# Patient Record
Sex: Male | Born: 1975 | Race: White | Hispanic: No | Marital: Single | Smoking: Current every day smoker
Health system: Southern US, Community
[De-identification: ages and names within clinical notes are randomized; demographics above are authoritative.]

## PROBLEM LIST (undated history)

## (undated) DIAGNOSIS — J45909 Unspecified asthma, uncomplicated: Secondary | ICD-10-CM

---

## 2008-02-16 ENCOUNTER — Emergency Department: Payer: Self-pay | Admitting: Emergency Medicine

## 2008-05-02 ENCOUNTER — Emergency Department: Payer: Self-pay | Admitting: Emergency Medicine

## 2011-10-19 ENCOUNTER — Emergency Department: Payer: Self-pay | Admitting: Emergency Medicine

## 2011-10-19 LAB — COMPREHENSIVE METABOLIC PANEL
Albumin: 4.4 g/dL (ref 3.4–5.0)
Alkaline Phosphatase: 61 U/L (ref 50–136)
Anion Gap: 5 — ABNORMAL LOW (ref 7–16)
BUN: 10 mg/dL (ref 7–18)
Calcium, Total: 8.9 mg/dL (ref 8.5–10.1)
Chloride: 107 mmol/L (ref 98–107)
Co2: 28 mmol/L (ref 21–32)
Creatinine: 1.01 mg/dL (ref 0.60–1.30)
EGFR (African American): >60
EGFR (Non-African Amer.): >60
Osmolality: 278 (ref 275–301)
Potassium: 3.6 mmol/L (ref 3.5–5.1)
SGOT(AST): 23 U/L (ref 15–37)
SGPT (ALT): 26 U/L
Total Protein: 7.8 g/dL (ref 6.4–8.2)

## 2011-10-19 LAB — CBC
HCT: 44.7 % (ref 40.0–52.0)
HGB: 15.1 g/dL (ref 13.0–18.0)
MCH: 29.6 pg (ref 26.0–34.0)
MCV: 88 fL (ref 80–100)
Platelet: 180 10*3/uL (ref 150–440)
RBC: 5.1 10*6/uL (ref 4.40–5.90)

## 2011-10-19 LAB — TROPONIN I: Troponin-I: <0.02 ng/mL

## 2011-10-20 ENCOUNTER — Emergency Department: Payer: Self-pay | Admitting: Emergency Medicine

## 2015-04-15 ENCOUNTER — Emergency Department
Admission: EM | Admit: 2015-04-15 | Discharge: 2015-04-15 | Disposition: A | Discharge status: Home / Self Care | Payer: Self-pay | Attending: Emergency Medicine | Admitting: Emergency Medicine

## 2015-04-15 ENCOUNTER — Encounter: Payer: Self-pay | Admitting: Emergency Medicine

## 2015-04-15 DIAGNOSIS — T675XXA Heat exhaustion, unspecified, initial encounter: Secondary | ICD-10-CM | POA: Insufficient documentation

## 2015-04-15 DIAGNOSIS — Y9289 Other specified places as the place of occurrence of the external cause: Secondary | ICD-10-CM | POA: Insufficient documentation

## 2015-04-15 DIAGNOSIS — Y93H2 Activity, gardening and landscaping: Secondary | ICD-10-CM | POA: Insufficient documentation

## 2015-04-15 DIAGNOSIS — X32XXXA Exposure to sunlight, initial encounter: Secondary | ICD-10-CM | POA: Insufficient documentation

## 2015-04-15 DIAGNOSIS — Y998 Other external cause status: Secondary | ICD-10-CM | POA: Insufficient documentation

## 2015-04-15 DIAGNOSIS — E860 Dehydration: Secondary | ICD-10-CM | POA: Insufficient documentation

## 2015-04-15 DIAGNOSIS — R51 Headache: Secondary | ICD-10-CM | POA: Insufficient documentation

## 2015-04-15 LAB — BASIC METABOLIC PANEL
ANION GAP: 9 (ref 5–15)
BUN: 16 mg/dL (ref 6–20)
CALCIUM: 9.1 mg/dL (ref 8.9–10.3)
CHLORIDE: 102 mmol/L (ref 101–111)
CO2: 24 mmol/L (ref 22–32)
CREATININE: 1.03 mg/dL (ref 0.61–1.24)
GFR calc non Af Amer: >60 mL/min (ref >60)
Glucose, Bld: 98 mg/dL (ref 65–99)
POTASSIUM: 3.1 mmol/L — AB (ref 3.5–5.1)
Sodium: 135 mmol/L (ref 135–145)

## 2015-04-15 LAB — CBC WITH DIFFERENTIAL/PLATELET
Basophils Absolute: 0 10*3/uL (ref 0–0.1)
Basophils Relative: 0 %
EOS PCT: 2 %
Eosinophils Absolute: 0.1 10*3/uL (ref 0–0.7)
HEMATOCRIT: 43.3 % (ref 40.0–52.0)
HEMOGLOBIN: 14.7 g/dL (ref 13.0–18.0)
LYMPHS ABS: 1.2 10*3/uL (ref 1.0–3.6)
LYMPHS PCT: 20 %
MCH: 28.9 pg (ref 26.0–34.0)
MCHC: 33.9 g/dL (ref 32.0–36.0)
MCV: 85.5 fL (ref 80.0–100.0)
MONO ABS: 0.6 10*3/uL (ref 0.2–1.0)
Monocytes Relative: 10 %
NEUTROS ABS: 4.1 10*3/uL (ref 1.4–6.5)
NEUTROS PCT: 68 %
Platelets: 187 10*3/uL (ref 150–440)
RBC: 5.06 MIL/uL (ref 4.40–5.90)
RDW: 14.3 % (ref 11.5–14.5)
WBC: 6.1 10*3/uL (ref 3.8–10.6)

## 2015-04-15 MED ORDER — KETOROLAC TROMETHAMINE 30 MG/ML IJ SOLN
30.0000 mg | Freq: Once | INTRAMUSCULAR | Status: AC
  Administered 2015-04-15: 30 mg via INTRAVENOUS
  Filled 2015-04-15: qty 1

## 2015-04-15 MED ORDER — SODIUM CHLORIDE 0.9 % IV BOLUS (SEPSIS)
2000.0000 mL | Freq: Once | INTRAVENOUS | Status: AC
  Administered 2015-04-15: 2000 mL via INTRAVENOUS
  Filled 2015-04-15: qty 2000

## 2015-04-15 NOTE — ED Notes (Signed)
Reports mowing lawn 2 hours pta, and passed out.  States since feels "drunk"

## 2015-04-15 NOTE — ED Provider Notes (Signed)
[facility] Emergency Department Provider Note    ____________________________________________  Time seen: 1210  I have reviewed the triage vital signs and the nursing notes.   HISTORY  Chief Complaint Loss of Consciousness   History limited by: Not Limited   HPI Steven Hubbard is a 39 y.o. male with no significant past medical history presents to the emergency department today after an episode of syncope. The patient states he had been out mowing a lawn for roughly 30 minutes when he passed out. He remembers coming to with his friends around. Since this happened he has felt "hung over". This occurred roughly 2 hours ago.Patient does state that he has not slept well the past 2 nights and has been drinking a large quantity of alcohol. He denied any chest pain or shortness of breath during the episode or around the time of the episode.     History reviewed. No pertinent past medical history.  There are no active problems to display for this patient.   History reviewed. No pertinent past surgical history.  No current outpatient prescriptions on file.  Allergies Review of patient's allergies indicates no known allergies.  History reviewed. No pertinent family history.  Social History History  Substance Use Topics  . Smoking status: Never Smoker   . Smokeless tobacco: Not on file  . Alcohol Use: Yes    Review of Systems  Constitutional: Negative for fever. Cardiovascular: Negative for chest pain. Respiratory: Negative for shortness of breath. Gastrointestinal: Negative for abdominal pain, vomiting and diarrhea. Genitourinary: Negative for dysuria. Musculoskeletal: Negative for back pain. Skin: Negative for rash. Neurological: Positive for headache   10-point ROS otherwise negative.  ____________________________________________   PHYSICAL EXAM:  VITAL SIGNS: ED Triage Vitals  Enc Vitals Group     BP 04/15/15 1154 132/85 mmHg   Pulse Rate 04/15/15 1154 104     Resp 04/15/15 1154 20     Temp 04/15/15 1154 98.5 F (36.9 C)     Temp src --      SpO2 04/15/15 1154 98 %     Weight 04/15/15 1154 200 lb (90.719 kg)     Height 04/15/15 1154 5\' 8"  (1.727 m)     Head Cir --      Peak Flow --      Pain Score 04/15/15 1155 8   Constitutional: Alert and oriented. Well appearing and in no distress. Eyes: Conjunctivae are normal. PERRL. Normal extraocular movements. ENT   Head: Normocephalic and atraumatic.   Nose: No congestion/rhinnorhea.   Mouth/Throat: Mucous membranes are moist.   Neck: No stridor. Hematological/Lymphatic/Immunilogical: No cervical lymphadenopathy. Cardiovascular: Tachycardic, regular rhythm.  No murmurs, rubs, or gallops. Respiratory: Normal respiratory effort without tachypnea nor retractions. Breath sounds are clear and equal bilaterally. No wheezes/rales/rhonchi. Gastrointestinal: Soft and nontender. No distention. There is no CVA tenderness. Genitourinary: Deferred Musculoskeletal: Normal range of motion in all extremities. No joint effusions.  No lower extremity tenderness nor edema. Neurologic:  Normal speech and language. No gross focal neurologic deficits are appreciated. Speech is normal.  Skin:  Skin is warm, dry and intact. No rash noted. Psychiatric: Mood and affect are normal. Speech and behavior are normal. Patient exhibits appropriate insight and judgment.  ____________________________________________    LABS (pertinent positives/negatives)  Labs Reviewed  BASIC METABOLIC PANEL - Abnormal; Notable for the following:    Potassium 3.1 (*)    All other components within normal limits  CBC WITH DIFFERENTIAL/PLATELET     ____________________________________________  EKG  I, Phineas Semen, attending physician, personally viewed and interpreted this EKG  EKG Time: 1156 Rate: 112 Rhythm: sinus tachycardia Axis: normal Intervals: qtc 450 QRS: narrow ST  changes: no st elevation    ____________________________________________    RADIOLOGY  None  ____________________________________________   PROCEDURES  Procedure(s) performed: None  Critical Care performed: No  ____________________________________________   INITIAL IMPRESSION / ASSESSMENT AND PLAN / ED COURSE  Pertinent labs & imaging results that were available during my care of the patient were reviewed by me and considered in my medical decision making (see chart for details).  Patient presents to the emergency department today after a syncopal episode while mowing the yard. On exam patient awake alert no acute distress. He is mildly tachycardic. I think likely that the sink abuse result of dehydration and heat exposure. Patient does admit to not having good by mouth intake recently. Will give medication for the headache, fluids and check basic blood work.  ----------------------------------------- 2:17 PM on 04/15/2015 -----------------------------------------  Patient states he is now feeling better. Blood work without any concerning findings. I did discuss with patient's importance of hydration and staying out of the heat. Discussed return precautions.  ____________________________________________   FINAL CLINICAL IMPRESSION(S) / ED DIAGNOSES  Final diagnoses:  Heat exhaustion, initial encounter  Dehydration     Phineas Semen, MD 04/15/15 1417

## 2015-04-15 NOTE — ED Notes (Signed)
Was pushing mower and woke up on ground, since has felt weak, states has been staying up past few nites due to friends social activites, does drink etoh

## 2015-04-15 NOTE — Discharge Instructions (Signed)
Please seek medical attention for any high fevers, chest pain, shortness of breath, change in behavior, persistent vomiting, bloody stool or any other new or concerning symptoms. ° °Dehydration, Adult °Dehydration is when you lose more fluids from the body than you take in. Vital organs like the kidneys, brain, and heart cannot function without a proper amount of fluids and salt. Any loss of fluids from the body can cause dehydration.  °CAUSES  °· Vomiting. °· Diarrhea. °· Excessive sweating. °· Excessive urine output. °· Fever. °SYMPTOMS  °Mild dehydration °· Thirst. °· Dry lips. °· Slightly dry mouth. °Moderate dehydration °· Very dry mouth. °· Sunken eyes. °· Skin does not bounce back quickly when lightly pinched and released. °· Dark urine and decreased urine production. °· Decreased tear production. °· Headache. °Severe dehydration °· Very dry mouth. °· Extreme thirst. °· Rapid, weak pulse (more than 100 beats per minute at rest). °· Cold hands and feet. °· Not able to sweat in spite of heat and temperature. °· Rapid breathing. °· Blue lips. °· Confusion and lethargy. °· Difficulty being awakened. °· Minimal urine production. °· No tears. °DIAGNOSIS  °Your caregiver will diagnose dehydration based on your symptoms and your exam. Blood and urine tests will help confirm the diagnosis. The diagnostic evaluation should also identify the cause of dehydration. °TREATMENT  °Treatment of mild or moderate dehydration can often be done at home by increasing the amount of fluids that you drink. It is best to drink small amounts of fluid more often. Drinking too much at one time can make vomiting worse. Refer to the home care instructions below. °Severe dehydration needs to be treated at the hospital where you will probably be given intravenous (IV) fluids that contain water and electrolytes. °HOME CARE INSTRUCTIONS  °· Ask your caregiver about specific rehydration instructions. °· Drink enough fluids to keep your urine  clear or pale yellow. °· Drink small amounts frequently if you have nausea and vomiting. °· Eat as you normally do. °· Avoid: °¨ Foods or drinks high in sugar. °¨ Carbonated drinks. °¨ Juice. °¨ Extremely hot or cold fluids. °¨ Drinks with caffeine. °¨ Fatty, greasy foods. °¨ Alcohol. °¨ Tobacco. °¨ Overeating. °¨ Gelatin desserts. °· Wash your hands well to avoid spreading bacteria and viruses. °· Only take over-the-counter or prescription medicines for pain, discomfort, or fever as directed by your caregiver. °· Ask your caregiver if you should continue all prescribed and over-the-counter medicines. °· Keep all follow-up appointments with your caregiver. °SEEK MEDICAL CARE IF: °· You have abdominal pain and it increases or stays in one area (localizes). °· You have a rash, stiff neck, or severe headache. °· You are irritable, sleepy, or difficult to awaken. °· You are weak, dizzy, or extremely thirsty. °SEEK IMMEDIATE MEDICAL CARE IF:  °· You are unable to keep fluids down or you get worse despite treatment. °· You have frequent episodes of vomiting or diarrhea. °· You have blood or green matter (bile) in your vomit. °· You have blood in your stool or your stool looks black and tarry. °· You have not urinated in 6 to 8 hours, or you have only urinated a small amount of very dark urine. °· You have a fever. °· You faint. °MAKE SURE YOU:  °· Understand these instructions. °· Will watch your condition. °· Will get help right away if you are not doing well or get worse. °Document Released: 09/18/2005 Document Revised: 12/11/2011 Document Reviewed: 05/08/2011 °[employer]® Patient Information ©2015 [employer].   This information is not intended to replace advice given to you by your health care provider. Make sure you discuss any questions you have with your health care provider. ° °

## 2015-04-24 ENCOUNTER — Encounter: Payer: Self-pay | Admitting: Emergency Medicine

## 2015-04-24 ENCOUNTER — Emergency Department
Admission: EM | Admit: 2015-04-24 | Discharge: 2015-04-24 | Disposition: A | Discharge status: Home / Self Care | Payer: Self-pay | Attending: Emergency Medicine | Admitting: Emergency Medicine

## 2015-04-24 DIAGNOSIS — Z720 Tobacco use: Secondary | ICD-10-CM | POA: Insufficient documentation

## 2015-04-24 DIAGNOSIS — K0381 Cracked tooth: Secondary | ICD-10-CM | POA: Insufficient documentation

## 2015-04-24 DIAGNOSIS — K029 Dental caries, unspecified: Secondary | ICD-10-CM | POA: Insufficient documentation

## 2015-04-24 MED ORDER — LIDOCAINE-EPINEPHRINE 2 %-1:100000 IJ SOLN
1.7000 mL | Freq: Once | INTRAMUSCULAR | Status: DC
  Filled 2015-04-24: qty 1.7

## 2015-04-24 MED ORDER — PENICILLIN V POTASSIUM 500 MG PO TABS: 500.0000 mg | ORAL_TABLET | Freq: Four times a day (QID) | ORAL | Status: DC

## 2015-04-24 MED ORDER — KETOROLAC TROMETHAMINE 10 MG PO TABS: 10.0000 mg | ORAL_TABLET | Freq: Three times a day (TID) | ORAL | Status: DC

## 2015-04-24 NOTE — Discharge Instructions (Signed)
Dental Caries Dental caries is tooth decay. This decay can cause a hole in teeth (cavity) that can get bigger and deeper over time. HOME CARE  Brush and floss your teeth. Do this at least two times a day.  Use a fluoride toothpaste.  Use a mouth rinse if told by your dentist or doctor.  Eat less sugary and starchy foods. Drink less sugary drinks.  Avoid snacking often on sugary and starchy foods. Avoid sipping often on sugary drinks.  Keep regular checkups and cleanings with your dentist.  Use fluoride supplements if told by your dentist or doctor.  Allow fluoride to be applied to teeth if told by your dentist or doctor. Document Released: 06/27/2008 Document Revised: 02/02/2014 Document Reviewed: 09/20/2012 [facility] Patient Information 2015 [place], [place]. This information is not intended to replace advice given to you by your health care provider. Make sure you discuss any questions you have with your health care provider.  Dental Pain A tooth ache may be caused by cavities (tooth decay). Cavities expose the nerve of the tooth to air and hot or cold temperatures. It may come from an infection or abscess (also called a boil or furuncle) around your tooth. It is also often caused by dental caries (tooth decay). This causes the pain you are having. DIAGNOSIS  Your caregiver can diagnose this problem by exam. TREATMENT   If caused by an infection, it may be treated with medications which kill germs (antibiotics) and pain medications as prescribed by your caregiver. Take medications as directed.  Only take over-the-counter or prescription medicines for pain, discomfort, or fever as directed by your caregiver.  Whether the tooth ache today is caused by infection or dental disease, you should see your dentist as soon as possible for further care. SEEK MEDICAL CARE IF: The exam and treatment you received today has been provided on an emergency basis only. This is not a substitute for  complete medical or dental care. If your problem worsens or new problems (symptoms) appear, and you are unable to meet with your dentist, call or return to this location. SEEK IMMEDIATE MEDICAL CARE IF:   You have a fever.  You develop redness and swelling of your face, jaw, or neck.  You are unable to open your mouth.  You have severe pain uncontrolled by pain medicine. MAKE SURE YOU:   Understand these instructions.  Will watch your condition.  Will get help right away if you are not doing well or get worse. Document Released: 09/18/2005 Document Revised: 12/11/2011 Document Reviewed: 05/06/2008 [facility] Patient Information 2015 [place], [place]. This information is not intended to replace advice given to you by your health care provider. Make sure you discuss any questions you have with your health care provider.   Take the prescription meds for pain and infection as directed. Follow-up with one of the dental clinics on the list provided.

## 2015-04-24 NOTE — ED Provider Notes (Signed)
[facility] Emergency Department Provider Note ____________________________________________  Time seen: 1405  I have reviewed the triage vital signs and the nursing notes.  HISTORY  Chief Complaint  Dental Pain  HPI Steven Hubbard is a 39 y.o. male reports to the ED with complaints of dental pain, after a recent fracture to one of his molars. He does admit to having a previous chronic fractures to the second molar. He denies any fever, chills, sweats, purulent discharge in the interim. He does not have a current dental provider for management and treatment of his chronic fractures and dental caries. He rates his pain at a 10/10 in triage.  History reviewed. No pertinent past medical history.  There are no active problems to display for this patient.  History reviewed. No pertinent past surgical history.  Current Outpatient Rx  Name  Route  Sig  Dispense  Refill  . ketorolac (TORADOL) 10 MG tablet   Oral   Take 1 tablet (10 mg total) by mouth every 8 (eight) hours.   15 tablet   0   . penicillin v potassium (VEETID) 500 MG tablet   Oral   Take 1 tablet (500 mg total) by mouth 4 (four) times daily.   40 tablet   0   Allergies Review of patient's allergies indicates no known allergies.  No family history on file.  Social History History  Substance Use Topics  . Smoking status: Current Every Day Smoker -- 0.30 packs/day    Types: Cigarettes  . Smokeless tobacco: Not on file  . Alcohol Use: Yes   Review of Systems  Constitutional: Negative for fever. Eyes: Negative for visual changes. ENT: Negative for sore throat. Dental pain as above. Cardiovascular: Negative for chest pain. Respiratory: Negative for shortness of breath. Gastrointestinal: Negative for abdominal pain, vomiting and diarrhea. Genitourinary: Negative for dysuria. Musculoskeletal: Negative for back pain. Skin: Negative for rash. Neurological: Negative for headaches, focal  weakness or numbness. ____________________________________________  PHYSICAL EXAM:  VITAL SIGNS: ED Triage Vitals  Enc Vitals Group     BP 04/24/15 1239 135/100 mmHg     Pulse Rate 04/24/15 1239 70     Resp 04/24/15 1239 18     Temp 04/24/15 1239 98 F (36.7 C)     Temp Source 04/24/15 1239 Oral     SpO2 04/24/15 1239 99 %     Weight 04/24/15 1239 200 lb (90.719 kg)     Height 04/24/15 1239  (1.727 m)     Head Cir --      Peak Flow --      Pain Score 04/24/15 1244 10     Pain Loc --      Pain Edu? --      Excl. in GC? --    Constitutional: Alert and oriented. Well appearing and in no distress. Eyes: Conjunctivae are normal. PERRL. Normal extraocular movements. ENT   Head: Normocephalic and atraumatic.   Nose: No congestion/rhinnorhea.   Mouth/Throat: Mucous membranes are moist. Lower molars on the right with chronic appearing fractures to the gumline on the 2nd molar, and the buccal aspect on the 1st molar. Gum swelling consistent with mild gingivitis.    Neck: Supple. No thyromegaly. Hematological/Lymphatic/Immunilogical: No cervical lymphadenopathy. Respiratory: Normal respiratory effort.  Musculoskeletal: Nontender with normal range of motion in all extremities.  Neurologic:  Normal gait without ataxia. Normal speech and language. No gross focal neurologic deficits are appreciated. Skin:  Skin is warm, dry and intact. No  rash noted. Psychiatric: Mood and affect are normal. Patient exhibits appropriate insight and judgment. ____________________________________________  PROCEDURES  Dental Block  Patient verbal consent obtained. Risks and benefits reviewed.   Lidocaine 2% w/ epinephrine 1.7 ml  Dental block procedure performed to right lower 1st (#30) and 2nd (#31) molars.   Patient tolerated the procedure well. Adequate anesthesia obtained.  ____________________________________________  INITIAL IMPRESSION / ASSESSMENT AND PLAN / ED COURSE  Acute  dental pain due to chronically decayed and broken lower molars. Acute pain management with local dental block. Prescription Toradol and Pen VK for pain & infection, respectively.  Patient provided with local dental provider for definitive care & management of dental caries and subsequent dental pain.  ____________________________________________  FINAL CLINICAL IMPRESSION(S) / ED DIAGNOSES  Final diagnoses:  Pain due to dental caries     Lissa Hoard, PA-C 04/24/15 1455  Governor Rooks, MD 04/25/15 [phone]

## 2015-04-24 NOTE — ED Notes (Signed)
Pt alert and oriented X4, active, cooperative, pt in NAD. RR even and unlabored, color WNL.  Pt informed to return if any life threatening symptoms occur.   

## 2015-07-21 ENCOUNTER — Emergency Department
Admission: EM | Admit: 2015-07-21 | Discharge: 2015-07-21 | Disposition: A | Discharge status: Home / Self Care | Payer: Self-pay | Attending: Emergency Medicine | Admitting: Emergency Medicine

## 2015-07-21 ENCOUNTER — Emergency Department: Payer: Self-pay

## 2015-07-21 DIAGNOSIS — Z792 Long term (current) use of antibiotics: Secondary | ICD-10-CM | POA: Insufficient documentation

## 2015-07-21 DIAGNOSIS — R531 Weakness: Secondary | ICD-10-CM | POA: Insufficient documentation

## 2015-07-21 DIAGNOSIS — R55 Syncope and collapse: Secondary | ICD-10-CM | POA: Insufficient documentation

## 2015-07-21 DIAGNOSIS — Z720 Tobacco use: Secondary | ICD-10-CM | POA: Insufficient documentation

## 2015-07-21 DIAGNOSIS — Z791 Long term (current) use of non-steroidal anti-inflammatories (NSAID): Secondary | ICD-10-CM | POA: Insufficient documentation

## 2015-07-21 DIAGNOSIS — R42 Dizziness and giddiness: Secondary | ICD-10-CM | POA: Insufficient documentation

## 2015-07-21 DIAGNOSIS — R202 Paresthesia of skin: Secondary | ICD-10-CM | POA: Insufficient documentation

## 2015-07-21 HISTORY — DX: Unspecified asthma, uncomplicated: J45.909

## 2015-07-21 LAB — BASIC METABOLIC PANEL
ANION GAP: 7 (ref 5–15)
BUN: 15 mg/dL (ref 6–20)
CALCIUM: 8.7 mg/dL — AB (ref 8.9–10.3)
CO2: 26 mmol/L (ref 22–32)
Chloride: 106 mmol/L (ref 101–111)
Creatinine, Ser: 0.98 mg/dL (ref 0.61–1.24)
GFR calc Af Amer: >60 mL/min (ref >60)
GLUCOSE: 99 mg/dL (ref 65–99)
Potassium: 3.6 mmol/L (ref 3.5–5.1)
Sodium: 139 mmol/L (ref 135–145)

## 2015-07-21 LAB — URINALYSIS COMPLETE WITH MICROSCOPIC (ARMC ONLY)
Bacteria, UA: NONE SEEN
Bilirubin Urine: NEGATIVE
GLUCOSE, UA: NEGATIVE mg/dL
Hgb urine dipstick: NEGATIVE
KETONES UR: NEGATIVE mg/dL
Leukocytes, UA: NEGATIVE
Nitrite: NEGATIVE
Protein, ur: NEGATIVE mg/dL
SPECIFIC GRAVITY, URINE: 1.011 (ref 1.005–1.030)
Squamous Epithelial / LPF: NONE SEEN
pH: 6 (ref 5.0–8.0)

## 2015-07-21 LAB — CBC
HCT: 45 % (ref 40.0–52.0)
Hemoglobin: 15.4 g/dL (ref 13.0–18.0)
MCH: 29.6 pg (ref 26.0–34.0)
MCHC: 34.2 g/dL (ref 32.0–36.0)
MCV: 86.7 fL (ref 80.0–100.0)
PLATELETS: 204 10*3/uL (ref 150–440)
RBC: 5.19 MIL/uL (ref 4.40–5.90)
RDW: 14.3 % (ref 11.5–14.5)
WBC: 6.6 10*3/uL (ref 3.8–10.6)

## 2015-07-21 NOTE — ED Notes (Signed)
Patient went to get coffee this morning and reports "feeling funny and blacking out" around 8am.  Patient currently is alert and oriented with complaints of numbness to both hands.

## 2015-07-21 NOTE — Discharge Instructions (Signed)
[facility] to establish a primary care physician. Please also call Dr. America BrownFath's office in cardiology to make an appointment for an echocardiogram and for evaluation of your repeat fainting spells.  Please make sure you eat multiple small meals throughout the day and drink plenty of fluid to stay well hydrated.  Return to the emergency department if you develop more fainting, chest pain, lightheadedness, fever, or any other symptoms concerning to you.

## 2015-07-21 NOTE — ED Provider Notes (Signed)
[facility] Emergency Department Provider Note  ____________________________________________  Time seen: Approximately 1:50 PM  I have reviewed the triage vital signs and the nursing notes.   HISTORY  Chief Complaint Near Syncope    HPI Steven Hubbard is a 39 y.o. male , otherwise healthy, presenting with syncope. Patient reports that he skipped breakfast this morning, went to get a coffee and while he was the passenger in a moving vehicle, he developed a syncopal episode. He describes preceding lightheadedness, a brief period of loss of consciousness, followed by "feeling like I was drunk" with bilateral finger "tingling."  The tingling has resolved However, he was alert and oriented immediately after his syncope. He did not have any injury from his syncope. He denies any chest pain, palpitations, or shortness of breath. At this time, he reports generalized malaise but otherwise feels back to normal baseline. He denies any headache, numbness tingling or weakness,or recent illness including cough, cold, nausea, vomiting, diarrhea, abdominal pain.  FH: Hypertension in mother, no family history of sudden cardiac death or young deaths for unknown causes.  SH: Occasional marijuana, positive smoking. No tobacco.   Past Medical History  Diagnosis Date  . Asthma     There are no active problems to display for this patient.   History reviewed. No pertinent past surgical history.  Current Outpatient Rx  Name  Route  Sig  Dispense  Refill  . ketorolac (TORADOL) 10 MG tablet   Oral   Take 1 tablet (10 mg total) by mouth every 8 (eight) hours.   15 tablet   0   . penicillin v potassium (VEETID) 500 MG tablet   Oral   Take 1 tablet (500 mg total) by mouth 4 (four) times daily.   40 tablet   0     Allergies Review of patient's allergies indicates no known allergies.  History reviewed. No pertinent family history.  Social History Social History   Substance Use Topics  . Smoking status: Current Every Day Smoker -- 0.30 packs/day    Types: Cigarettes  . Smokeless tobacco: None  . Alcohol Use: Yes    Review of Systems Constitutional: No fever/chills. Positive syncope. Positive lightheadedness. Eyes: No visual changes. ENT: No sore throat. Cardiovascular: Denies chest pain, palpitations. Respiratory: Denies shortness of breath.  No cough. Gastrointestinal: No abdominal pain.  No nausea, no vomiting.  No diarrhea.  No constipation. Genitourinary: Negative for dysuria. Musculoskeletal: Negative for back pain. Skin: Negative for rash. Neurological: Negative for headaches, focal weakness or numbness. Positive for bilateral finger tingling.  10-point ROS otherwise negative.  ____________________________________________   PHYSICAL EXAM:  VITAL SIGNS: ED Triage Vitals  Enc Vitals Group     BP 07/21/15 0958 152/97 mmHg     Pulse Rate 07/21/15 0958 107     Resp 07/21/15 0958 16     Temp 07/21/15 0958 98.4 F (36.9 C)     Temp Source 07/21/15 0958 Oral     SpO2 07/21/15 0958 96 %     Weight 07/21/15 0958 190 lb (86.183 kg)     Height 07/21/15 0958  (1.727 m)     Head Cir --      Peak Flow --      Pain Score --      Pain Loc --      Pain Edu? --      Excl. in GC? --     Constitutional: Alert and oriented. Well appearing and in no acute  distress. Answer question appropriately. Eyes: Conjunctivae are normal.  EOMI. PERRLA. Head: Atraumatic. No raccoon eyes or Battle sign. Nose: No congestion/rhinnorhea. Mouth/Throat: Mucous membranes are moist.  Neck: No stridor.  Supple.  No JVD. Trachea is midline. Full range of motion without pain. Cardiovascular: Normal rate, regular rhythm. No murmurs, rubs or gallops.  Respiratory: Normal respiratory effort.  No retractions. Lungs CTAB.  No wheezes, rales or ronchi. Gastrointestinal: Soft and nontender. No distention. No peritoneal signs. Musculoskeletal: No LE edema. No  palpable cords or calf pain. No Homans sign. Neurologic:  Alert and oriented 3. Speech is clear. Face and smile are symmetric. Extraocular movements are intact and PERRLA. Moves all extremities well. Skin:  Skin is warm, dry and intact. No rash noted. Psychiatric: Mood and affect are normal. Speech and behavior are normal.  Normal judgement.  ____________________________________________   LABS (all labs ordered are listed, but only abnormal results are displayed)  Labs Reviewed  BASIC METABOLIC PANEL - Abnormal; Notable for the following:    Calcium 8.7 (*)    All other components within normal limits  URINALYSIS COMPLETEWITH MICROSCOPIC ([facility] ONLY) - Abnormal; Notable for the following:    Color, Urine YELLOW (*)    APPearance CLEAR (*)    All other components within normal limits  CBC   ____________________________________________  EKG  ED ECG REPORT I, Rockne MenghiniNorman, Anne-Caroline, the attending physician, personally viewed and interpreted this ECG.   Date: 07/21/2015  EKG Time: 1016  Rate: 85  Rhythm: normal sinus rhythm  Axis: Normal  Intervals:none; no QT prolongation  ST&T Change: No ST elevation. No findings consistent with Brugada syndrome.  ____________________________________________  RADIOLOGY  Dg Chest 2 View  07/21/2015  CLINICAL DATA:  Syncope EXAM: CHEST  2 VIEW COMPARISON:  10/19/2011 chest radiograph FINDINGS: Stable cardiomediastinal silhouette with normal heart size. No pneumothorax. No pleural effusion. Clear lungs, with no focal lung consolidation and no pulmonary edema. IMPRESSION: No active cardiopulmonary disease. Electronically Signed   By: Delbert PhenixJason A Poff M.D.   On: 07/21/2015 12:38    ____________________________________________   PROCEDURES  Procedure(s) performed: None  Critical Care performed: No ____________________________________________   INITIAL IMPRESSION / ASSESSMENT AND PLAN / ED COURSE  Pertinent labs & imaging results that were  available during my care of the patient were reviewed by me and considered in my medical decision making (see chart for details).  39 y.o. male, otherwise healthy, presenting with syncopal episode after skipping breakfast. This is his second syncopal episode this year with negative workups in the emergency department. I do not hear murmur but have encouraged the patient to follow up as an outpatient for an echocardiogram. I will check basic labs, chest x-ray, and give the patient IV fluids. If the workup in the emergency department is negative, I have encouraged him to make an appointment with a primary care physician.  ----------------------------------------- 1:55 PM on 07/21/2015 -----------------------------------------  Patient initially presented with mild tachycardia at 107, which has resolved spontaneously. He has labs which are reassuring with normal electrolytes and no elevation in his white blood cell count. His urinalysis is negative for infection. His chest x-ray does not show any active cardiopulmonary disease. She is feeling better and I will plan discharge.  ____________________________________________  FINAL CLINICAL IMPRESSION(S) / ED DIAGNOSES  Final diagnoses:  Syncope, unspecified syncope type  Weakness      NEW MEDICATIONS STARTED DURING THIS VISIT:  New Prescriptions   No medications on file     Rockne MenghiniAnne-Caroline Derrall Hicks, MD 07/21/15  1355 

## 2015-10-25 ENCOUNTER — Encounter: Payer: Self-pay | Admitting: *Deleted

## 2015-10-25 ENCOUNTER — Emergency Department
Admission: EM | Admit: 2015-10-25 | Discharge: 2015-10-25 | Disposition: A | Discharge status: Home / Self Care | Payer: Self-pay | Attending: Emergency Medicine | Admitting: Emergency Medicine

## 2015-10-25 DIAGNOSIS — S86811A Strain of other muscle(s) and tendon(s) at lower leg level, right leg, initial encounter: Secondary | ICD-10-CM | POA: Insufficient documentation

## 2015-10-25 DIAGNOSIS — Y9372 Activity, wrestling: Secondary | ICD-10-CM | POA: Insufficient documentation

## 2015-10-25 DIAGNOSIS — F17210 Nicotine dependence, cigarettes, uncomplicated: Secondary | ICD-10-CM | POA: Insufficient documentation

## 2015-10-25 DIAGNOSIS — Z792 Long term (current) use of antibiotics: Secondary | ICD-10-CM | POA: Insufficient documentation

## 2015-10-25 DIAGNOSIS — Z791 Long term (current) use of non-steroidal anti-inflammatories (NSAID): Secondary | ICD-10-CM | POA: Insufficient documentation

## 2015-10-25 DIAGNOSIS — Y9289 Other specified places as the place of occurrence of the external cause: Secondary | ICD-10-CM | POA: Insufficient documentation

## 2015-10-25 DIAGNOSIS — X501XXA Overexertion from prolonged static or awkward postures, initial encounter: Secondary | ICD-10-CM | POA: Insufficient documentation

## 2015-10-25 DIAGNOSIS — Y998 Other external cause status: Secondary | ICD-10-CM | POA: Insufficient documentation

## 2015-10-25 DIAGNOSIS — S83421A Sprain of lateral collateral ligament of right knee, initial encounter: Secondary | ICD-10-CM

## 2015-10-25 MED ORDER — NAPROXEN 500 MG PO TABS
500.0000 mg | ORAL_TABLET | Freq: Once | ORAL | Status: AC
  Administered 2015-10-25: 500 mg via ORAL
  Filled 2015-10-25: qty 1

## 2015-10-25 MED ORDER — TRAMADOL HCL 50 MG PO TABS: 50.0000 mg | ORAL_TABLET | Freq: Four times a day (QID) | ORAL | Status: DC | PRN

## 2015-10-25 MED ORDER — NAPROXEN 500 MG PO TABS: 500.0000 mg | ORAL_TABLET | Freq: Two times a day (BID) | ORAL | Status: DC

## 2015-10-25 NOTE — ED Provider Notes (Signed)
[facility] Emergency Department Provider Note  ____________________________________________  Time seen: Approximately 12:16 PM  I have reviewed the triage vital signs and the nursing notes.   HISTORY  Chief Complaint Leg Injury    HPI Steven Hubbard is a 40 y.o. male patient complaining of right lateral leg pain radiating from the distal right femur to the insertion point of the LCL. Patient said he was drinking last night and got into a wrestling match with her friend. Patient stated there was a twisting incident to his right leg which he thought would be better this morning.Patient said upon awakening he had increased pain with weightbearing. No palliative measures for this complaint. Patient rated his pain as a 10 over 10 describes the pain as "sharp".   Past Medical History  Diagnosis Date  . Asthma     There are no active problems to display for this patient.   History reviewed. No pertinent past surgical history.  Current Outpatient Rx  Name  Route  Sig  Dispense  Refill  . ketorolac (TORADOL) 10 MG tablet   Oral   Take 1 tablet (10 mg total) by mouth every 8 (eight) hours.   15 tablet   0   . naproxen (NAPROSYN) 500 MG tablet   Oral   Take 1 tablet (500 mg total) by mouth 2 (two) times daily with a meal.   20 tablet   0   . penicillin v potassium (VEETID) 500 MG tablet   Oral   Take 1 tablet (500 mg total) by mouth 4 (four) times daily.   40 tablet   0   . traMADol (ULTRAM) 50 MG tablet   Oral   Take 1 tablet (50 mg total) by mouth every 6 (six) hours as needed for moderate pain.   12 tablet   0     Allergies Review of patient's allergies indicates no known allergies.  History reviewed. No pertinent family history.  Social History Social History  Substance Use Topics  . Smoking status: Current Every Day Smoker -- 0.25 packs/day    Types: Cigarettes  . Smokeless tobacco: None  . Alcohol Use: Yes    Review of  Systems Constitutional: No fever/chills Eyes: No visual changes. ENT: No sore throat. Cardiovascular: Denies chest pain. Respiratory: Denies shortness of breath. Gastrointestinal: No abdominal pain.  No nausea, no vomiting.  No diarrhea.  No constipation. Genitourinary: Negative for dysuria. Musculoskeletal: Right leg pain. Skin: Negative for rash. Neurological: Negative for headaches, focal weakness or numbness. 10-point ROS otherwise negative.  ____________________________________________   PHYSICAL EXAM:  VITAL SIGNS: ED Triage Vitals  Enc Vitals Group     BP 10/25/15 1109 140/92 mmHg     Pulse Rate 10/25/15 1109 88     Resp 10/25/15 1109 16     Temp --      Temp src --      SpO2 10/25/15 1109 97 %     Weight 10/25/15 1109 190 lb (86.183 kg)     Height 10/25/15 1109  (1.727 m)     Head Cir --      Peak Flow --      Pain Score 10/25/15 1107 10     Pain Loc --      Pain Edu? --      Excl. in GC? --     Constitutional: Alert and oriented. Well appearing and in no acute distress. Eyes: Conjunctivae are normal. PERRL. EOMI. Head: Atraumatic. Nose: No  congestion/rhinnorhea. Mouth/Throat: Mucous membranes are moist.  Oropharynx non-erythematous. Neck: No stridor. No cervical spine tenderness to palpation. Hematological/Lymphatic/Immunilogical: No cervical lymphadenopathy. Cardiovascular: Normal rate, regular rhythm. Grossly normal heart sounds.  Good peripheral circulation. Respiratory: Normal respiratory effort.  No retractions. Lungs CTAB. Gastrointestinal: Soft and nontender. No distention. No abdominal bruits. No CVA tenderness. Musculoskeletal: No obvious deformity or edema to the right lower extremity. Patient has full nuchal range of motion. Patient has moderate guarding palpation insertion point of the LCL. Patient weight bears without difficulty. Neurologic:  Normal speech and language. No gross focal neurologic deficits are appreciated. No gait  instability. Skin:  Skin is warm, dry and intact. No rash noted. Psychiatric: Mood and affect are normal. Speech and behavior are normal.  ____________________________________________   LABS (all labs ordered are listed, but only abnormal results are displayed)  Labs Reviewed - No data to display ____________________________________________  EKG   ____________________________________________  RADIOLOGY   ____________________________________________   PROCEDURES  Procedure(s) performed: None  Critical Care performed: No  ____________________________________________   INITIAL IMPRESSION / ASSESSMENT AND PLAN / ED COURSE  Pertinent labs & imaging results that were available during my care of the patient were reviewed by me and considered in my medical decision making (see chart for details).  Lateral collateral ligament strain of the right leg.. Patient given discharge Instructions. Patient placed in the immobilizer. Patient given prescription for naproxen and tramadol. Patient given a work note for 2 days. Patient [facility]. ____________________________________________   FINAL CLINICAL IMPRESSION(S) / ED DIAGNOSES  Final diagnoses:  Sprain and strain of lateral collateral ligament of knee, right, initial encounter      Joni Reining, PA-C 10/25/15 1243  Emily Filbert, MD 10/25/15 1455

## 2015-10-25 NOTE — ED Notes (Signed)
Pt reports feeling as though he "twisted" right leg last night while wrestling. Pt denies hearing a pop but today reports from knee to ankle is hurting. Pain increases with ambulation and with palpation. No swelling or bruising noted.

## 2015-10-30 ENCOUNTER — Encounter: Payer: Self-pay | Admitting: Emergency Medicine

## 2015-10-30 ENCOUNTER — Emergency Department
Admission: EM | Admit: 2015-10-30 | Discharge: 2015-10-30 | Disposition: A | Discharge status: Home / Self Care | Payer: Self-pay | Attending: Emergency Medicine | Admitting: Emergency Medicine

## 2015-10-30 DIAGNOSIS — F17210 Nicotine dependence, cigarettes, uncomplicated: Secondary | ICD-10-CM | POA: Insufficient documentation

## 2015-10-30 DIAGNOSIS — Z791 Long term (current) use of non-steroidal anti-inflammatories (NSAID): Secondary | ICD-10-CM | POA: Insufficient documentation

## 2015-10-30 DIAGNOSIS — Z79899 Other long term (current) drug therapy: Secondary | ICD-10-CM | POA: Insufficient documentation

## 2015-10-30 DIAGNOSIS — X501XXD Overexertion from prolonged static or awkward postures, subsequent encounter: Secondary | ICD-10-CM | POA: Insufficient documentation

## 2015-10-30 DIAGNOSIS — Z792 Long term (current) use of antibiotics: Secondary | ICD-10-CM | POA: Insufficient documentation

## 2015-10-30 DIAGNOSIS — S83421D Sprain of lateral collateral ligament of right knee, subsequent encounter: Secondary | ICD-10-CM

## 2015-10-30 DIAGNOSIS — S86811D Strain of other muscle(s) and tendon(s) at lower leg level, right leg, subsequent encounter: Secondary | ICD-10-CM | POA: Insufficient documentation

## 2015-10-30 MED ORDER — TRAMADOL HCL 50 MG PO TABS
50.0000 mg | ORAL_TABLET | Freq: Once | ORAL | Status: AC
  Administered 2015-10-30: 50 mg via ORAL
  Filled 2015-10-30: qty 1

## 2015-10-30 MED ORDER — TRAMADOL HCL 50 MG PO TABS
50.0000 mg | ORAL_TABLET | Freq: Four times a day (QID) | ORAL | Status: DC | PRN
Start: 2015-10-30 — End: 2016-07-17

## 2015-10-30 NOTE — Discharge Instructions (Signed)
Wearing a knee support and ambulate with crutches 3-5 days as needed. [facility] if no improvement.

## 2015-10-30 NOTE — ED Provider Notes (Signed)
[facility] Emergency Department Provider Note  ____________________________________________  Time seen: Approximately 10:49 PM  I have reviewed the triage vital signs and the nursing notes.   HISTORY  Chief Complaint Knee Pain    HPI Steven Hubbard is a 40 y.o. male patient complaining of continual right knee pain secondary to wrestling incident with ago.Patient was seen on 10/25/2015 for this complaint. Patient is placed in a knee immobilizer but he has a history of noncompliance secondary to working. Patient states the pain is the same as previous presentation. No other palliative measures taken for this complaint. He is rating his pain as a 10 over 10. Patient is not wearing the knee immobilizer as directed. States the splint is at home.   Past Medical History  Diagnosis Date  . Asthma     There are no active problems to display for this patient.   History reviewed. No pertinent past surgical history.  Current Outpatient Rx  Name  Route  Sig  Dispense  Refill  . ketorolac (TORADOL) 10 MG tablet   Oral   Take 1 tablet (10 mg total) by mouth every 8 (eight) hours.   15 tablet   0   . naproxen (NAPROSYN) 500 MG tablet   Oral   Take 1 tablet (500 mg total) by mouth 2 (two) times daily with a meal.   20 tablet   0   . penicillin v potassium (VEETID) 500 MG tablet   Oral   Take 1 tablet (500 mg total) by mouth 4 (four) times daily.   40 tablet   0   . traMADol (ULTRAM) 50 MG tablet   Oral   Take 1 tablet (50 mg total) by mouth every 6 (six) hours as needed for moderate pain.   12 tablet   0   . traMADol (ULTRAM) 50 MG tablet   Oral   Take 1 tablet (50 mg total) by mouth every 6 (six) hours as needed.   20 tablet   0     Allergies Review of patient's allergies indicates no known allergies.  No family history on file.  Social History Social History  Substance Use Topics  . Smoking status: Current Every Day Smoker -- 0.25  packs/day for 1 years    Types: Cigarettes  . Smokeless tobacco: None  . Alcohol Use: Yes     Comment: occasioanly    Review of Systems Constitutional: No fever/chills Eyes: No visual changes. ENT: No sore throat. Cardiovascular: Denies chest pain. Respiratory: Denies shortness of breath. Gastrointestinal: No abdominal pain.  No nausea, no vomiting.  No diarrhea.  No constipation. Genitourinary: Negative for dysuria. Musculoskeletal: Right knee pain Skin: Negative for rash. Neurological: Negative for headaches, focal weakness or numbness. 10-point ROS otherwise negative.  ____________________________________________   PHYSICAL EXAM:  VITAL SIGNS: ED Triage Vitals  Enc Vitals Group     BP 10/30/15 2056 131/87 mmHg     Pulse Rate 10/30/15 2056 85     Resp 10/30/15 2056 16     Temp 10/30/15 2056 98.4 F (36.9 C)     Temp Source 10/30/15 2056 Oral     SpO2 10/30/15 2056 96 %     Weight 10/30/15 2056 190 lb (86.183 kg)     Height 10/30/15 2056  (1.727 m)     Head Cir --      Peak Flow --      Pain Score 10/30/15 2056 10     Pain Loc --  Pain Edu? --      Excl. in GC? --     Constitutional: Alert and oriented. Well appearing and in no acute distress. Eyes: Conjunctivae are normal. PERRL. EOMI. Head: Atraumatic. Nose: No congestion/rhinnorhea. Mouth/Throat: Mucous membranes are moist.  Oropharynx non-erythematous. Neck: No stridor.  No cervical spine tenderness to palpation. Hematological/Lymphatic/Immunilogical: No cervical lymphadenopathy. Cardiovascular: Normal rate, regular rhythm. Grossly normal heart sounds.  Good peripheral circulation. Respiratory: Normal respiratory effort.  No retractions. Lungs CTAB. Gastrointestinal: Soft and nontender. No distention. No abdominal bruits. No CVA tenderness. Musculoskeletal: No obvious deformity edema or ecchymosis. Patient ambulating without atypical gait. Patient has some moderate guarding with insertion point of  the LCL. Neurologic:  Normal speech and language. No gross focal neurologic deficits are appreciated. No gait instability. Skin:  Skin is warm, dry and intact. No rash noted. Psychiatric: Mood and affect are normal. Speech and behavior are normal.  ____________________________________________   LABS (all labs ordered are listed, but only abnormal results are displayed)  Labs Reviewed - No data to display ____________________________________________  EKG  ____________________________________________  RADIOLOGY   ____________________________________________   PROCEDURES  Procedure(s) performed: None  Critical Care performed: No  ____________________________________________   INITIAL IMPRESSION / ASSESSMENT AND PLAN / ED COURSE  Pertinent labs & imaging results that were available during my care of the patient were reviewed by me and considered in my medical decision making (see chart for details).  LCL strain right knee. Patient advised to restart using a knee immobilizer and ambulate with crutches for 3-5 days. Patient advised to follow-up with orthopedics and is no improvement in one week. ____________________________________________   FINAL CLINICAL IMPRESSION(S) / ED DIAGNOSES  Final diagnoses:  Sprain and strain of lateral collateral ligament of knee, right, subsequent encounter      Joni Reining, PA-C 10/30/15 2346  Jeanmarie Plant, MD 10/30/15 2356

## 2015-10-30 NOTE — ED Notes (Signed)
Patient was seen here Monday for right knee pain from twisting it from wrestling. Patient states that he was told he had a strained LCL. Patient states he was sent home with a knee immobilizer and took it off for a day. Patient reports that the pain has not improved in his knee. Patient states that last time he was offered crutches but told staff he did not need them. Patient states that he would like crutches now.

## 2015-12-07 ENCOUNTER — Emergency Department
Admission: EM | Admit: 2015-12-07 | Discharge: 2015-12-07 | Disposition: A | Discharge status: Home / Self Care | Payer: Self-pay | Attending: Emergency Medicine | Admitting: Emergency Medicine

## 2015-12-07 ENCOUNTER — Encounter: Payer: Self-pay | Admitting: Medical Oncology

## 2015-12-07 DIAGNOSIS — R509 Fever, unspecified: Secondary | ICD-10-CM | POA: Insufficient documentation

## 2015-12-07 DIAGNOSIS — R0981 Nasal congestion: Secondary | ICD-10-CM | POA: Insufficient documentation

## 2015-12-07 DIAGNOSIS — R0982 Postnasal drip: Secondary | ICD-10-CM | POA: Insufficient documentation

## 2015-12-07 DIAGNOSIS — Z791 Long term (current) use of non-steroidal anti-inflammatories (NSAID): Secondary | ICD-10-CM | POA: Insufficient documentation

## 2015-12-07 DIAGNOSIS — R52 Pain, unspecified: Secondary | ICD-10-CM | POA: Insufficient documentation

## 2015-12-07 DIAGNOSIS — F17210 Nicotine dependence, cigarettes, uncomplicated: Secondary | ICD-10-CM | POA: Insufficient documentation

## 2015-12-07 DIAGNOSIS — R6889 Other general symptoms and signs: Secondary | ICD-10-CM

## 2015-12-07 DIAGNOSIS — R112 Nausea with vomiting, unspecified: Secondary | ICD-10-CM | POA: Insufficient documentation

## 2015-12-07 DIAGNOSIS — Z79899 Other long term (current) drug therapy: Secondary | ICD-10-CM | POA: Insufficient documentation

## 2015-12-07 DIAGNOSIS — J45909 Unspecified asthma, uncomplicated: Secondary | ICD-10-CM | POA: Insufficient documentation

## 2015-12-07 DIAGNOSIS — Z792 Long term (current) use of antibiotics: Secondary | ICD-10-CM | POA: Insufficient documentation

## 2015-12-07 DIAGNOSIS — R197 Diarrhea, unspecified: Secondary | ICD-10-CM | POA: Insufficient documentation

## 2015-12-07 MED ORDER — PSEUDOEPH-BROMPHEN-DM 30-2-10 MG/5ML PO SYRP: 5.0000 mL | ORAL_SOLUTION | Freq: Four times a day (QID) | ORAL | Status: DC | PRN

## 2015-12-07 MED ORDER — IBUPROFEN 800 MG PO TABS: 800.0000 mg | ORAL_TABLET | Freq: Three times a day (TID) | ORAL | Status: DC | PRN

## 2015-12-07 MED ORDER — OSELTAMIVIR PHOSPHATE 75 MG PO CAPS: 75.0000 mg | ORAL_CAPSULE | Freq: Two times a day (BID) | ORAL | Status: AC

## 2015-12-07 NOTE — ED Notes (Signed)
States flu sx's for about 2 days    Some fever and body aches  Afebrile on arrival to ED  PA in room on arrival to treatment room

## 2015-12-07 NOTE — ED Notes (Signed)
Pt reports flu like sx's x 2 days.  

## 2015-12-07 NOTE — ED Provider Notes (Signed)
[facility] Emergency Department Provider Note  ____________________________________________  Time seen: Approximately 7:13 AM  I have reviewed the triage vital signs and the nursing notes.   HISTORY  Chief Complaint Influenza    HPI Steven Hubbard is a 40 y.o. male patient complaining of nasal congestion, fever, chills, and body aches. Patient also complaining of nausea vomiting diarrhea. Patient state he has a productive cough. Patient taken over-the-counter anti-inflammatory medication with some mild relief. Patient state multiple cases of flu at his job. No other palliative measures taken for this complaint.   Past Medical History  Diagnosis Date  . Asthma     There are no active problems to display for this patient.   History reviewed. No pertinent past surgical history.  Current Outpatient Rx  Name  Route  Sig  Dispense  Refill  . brompheniramine-pseudoephedrine-DM 30-2-10 MG/5ML syrup   Oral   Take 5 mLs by mouth 4 (four) times daily as needed.   120 mL   0   . ibuprofen (ADVIL,MOTRIN) 800 MG tablet   Oral   Take 1 tablet (800 mg total) by mouth every 8 (eight) hours as needed for moderate pain.   15 tablet   0   . ketorolac (TORADOL) 10 MG tablet   Oral   Take 1 tablet (10 mg total) by mouth every 8 (eight) hours.   15 tablet   0   . naproxen (NAPROSYN) 500 MG tablet   Oral   Take 1 tablet (500 mg total) by mouth 2 (two) times daily with a meal.   20 tablet   0   . oseltamivir (TAMIFLU) 75 MG capsule   Oral   Take 1 capsule (75 mg total) by mouth 2 (two) times daily.   20 capsule   0   . penicillin v potassium (VEETID) 500 MG tablet   Oral   Take 1 tablet (500 mg total) by mouth 4 (four) times daily.   40 tablet   0   . traMADol (ULTRAM) 50 MG tablet   Oral   Take 1 tablet (50 mg total) by mouth every 6 (six) hours as needed for moderate pain.   12 tablet   0   . traMADol (ULTRAM) 50 MG tablet   Oral   Take 1  tablet (50 mg total) by mouth every 6 (six) hours as needed.   20 tablet   0     Allergies Review of patient's allergies indicates no known allergies.  No family history on file.  Social History Social History  Substance Use Topics  . Smoking status: Current Every Day Smoker -- 0.25 packs/day for 1 years    Types: Cigarettes  . Smokeless tobacco: None  . Alcohol Use: Yes     Comment: occasioanly    Review of Systems Constitutional: Fever and chills. Body aches Eyes: No visual changes. ENT: No sore throat. Cardiovascular: Denies chest pain. Respiratory: Denies shortness of breath. At that the cough Gastrointestinal: No abdominal pain.  Nausea vomiting diarrhea  No constipation. Genitourinary: Negative for dysuria. Musculoskeletal: Negative for back pain. Skin: Negative for rash. Neurological: Negative for headaches, focal weakness or numbness.   ____________________________________________   PHYSICAL EXAM:  VITAL SIGNS: ED Triage Vitals  Enc Vitals Group     BP 12/07/15 0706 144/87 mmHg     Pulse Rate 12/07/15 0706 73     Resp 12/07/15 0706 18     Temp 12/07/15 0706 97.7 F (36.5 C)  Temp Source 12/07/15 0706 Oral     SpO2 12/07/15 0706 99 %     Weight 12/07/15 0706 190 lb (86.183 kg)     Height 12/07/15 0706  (1.727 m)     Head Cir --      Peak Flow --      Pain Score 12/07/15 0707 0     Pain Loc --      Pain Edu? --      Excl. in GC? --     Constitutional: Alert and oriented. Appears malaise Eyes: Conjunctivae are normal. PERRL. EOMI. Head: Atraumatic. Nose: Edematous bilateral nasal turbinates clear rhinorrhea  Mouth/Throat: Mucous membranes are moist.  Oropharynx erythematous. Postnasal drainage Neck: No stridor.  No cervical spine tenderness to palpation. Hematological/Lymphatic/Immunilogical: No cervical lymphadenopathy. Cardiovascular: Normal rate, regular rhythm. Grossly normal heart sounds.  Good peripheral circulation. Respiratory:  Normal respiratory effort.  No retractions. Lungs CTAB. Gastrointestinal: Soft and nontender. No distention. No abdominal bruits. No CVA tenderness. Musculoskeletal: No lower extremity tenderness nor edema.  No joint effusions. Neurologic:  Normal speech and language. No gross focal neurologic deficits are appreciated. No gait instability. Skin:  Skin is warm, dry and intact. No rash noted. Psychiatric: Mood and affect are normal. Speech and behavior are normal.  ____________________________________________   LABS (all labs ordered are listed, but only abnormal results are displayed)  Labs Reviewed - No data to display ____________________________________________  EKG   ____________________________________________  RADIOLOGY   ____________________________________________   PROCEDURES  Procedure(s) performed: None  Critical Care performed: No  ____________________________________________   INITIAL IMPRESSION / ASSESSMENT AND PLAN / ED COURSE  Pertinent labs & imaging results that were available during my care of the patient were reviewed by me and considered in my medical decision making (see chart for details).  Flu type symptoms. Patient given discharge Instructions. Patient given a work note for 2 days. Patient given a prescription for Tamiflu, Bromfed-DM, and ibuprofen. [facility]. ____________________________________________   FINAL CLINICAL IMPRESSION(S) / ED DIAGNOSES  Final diagnoses:  Flu-like symptoms       Joni Reining, PA-C 12/07/15 1610  Emily Filbert, MD 12/07/15 [phone]

## 2016-02-18 ENCOUNTER — Emergency Department
Admission: EM | Admit: 2016-02-18 | Discharge: 2016-02-18 | Disposition: A | Discharge status: Home / Self Care | Payer: Self-pay

## 2016-05-31 ENCOUNTER — Encounter: Payer: Self-pay | Admitting: *Deleted

## 2016-05-31 ENCOUNTER — Emergency Department: Payer: Self-pay

## 2016-05-31 ENCOUNTER — Emergency Department
Admission: EM | Admit: 2016-05-31 | Discharge: 2016-05-31 | Disposition: A | Discharge status: Home / Self Care | Payer: Self-pay | Attending: Emergency Medicine | Admitting: Emergency Medicine

## 2016-05-31 DIAGNOSIS — Y929 Unspecified place or not applicable: Secondary | ICD-10-CM | POA: Insufficient documentation

## 2016-05-31 DIAGNOSIS — J45909 Unspecified asthma, uncomplicated: Secondary | ICD-10-CM | POA: Insufficient documentation

## 2016-05-31 DIAGNOSIS — F17210 Nicotine dependence, cigarettes, uncomplicated: Secondary | ICD-10-CM | POA: Insufficient documentation

## 2016-05-31 DIAGNOSIS — S60221A Contusion of right hand, initial encounter: Secondary | ICD-10-CM | POA: Insufficient documentation

## 2016-05-31 DIAGNOSIS — Y939 Activity, unspecified: Secondary | ICD-10-CM | POA: Insufficient documentation

## 2016-05-31 DIAGNOSIS — Y999 Unspecified external cause status: Secondary | ICD-10-CM | POA: Insufficient documentation

## 2016-05-31 DIAGNOSIS — W228XXA Striking against or struck by other objects, initial encounter: Secondary | ICD-10-CM | POA: Insufficient documentation

## 2016-05-31 MED ORDER — NAPROXEN 500 MG PO TABS: 500.0000 mg | ORAL_TABLET | Freq: Two times a day (BID) | ORAL | Status: DC

## 2016-05-31 MED ORDER — TRAMADOL HCL 50 MG PO TABS: 50.0000 mg | ORAL_TABLET | Freq: Four times a day (QID) | ORAL | 0 refills | Status: DC | PRN

## 2016-05-31 NOTE — ED Notes (Signed)
Pt in via triage; pt reports punching a door "a couple times" states, "it didn't feel right, so I wanted to get it checked out."  Swelling to right hand, limited ROM to right hand, wrist.  Pt A/Ox4, no immediate distress noted.

## 2016-05-31 NOTE — ED Triage Notes (Signed)
Pt hit a wall with right hand, pt complains of right hand with swelling

## 2016-05-31 NOTE — ED Provider Notes (Signed)
[facility] Emergency Department Provider Note   ____________________________________________   First MD Initiated Contact with Patient 05/31/16 1340     (approximate)  I have reviewed the triage vital signs and the nursing notes.   HISTORY  Chief Complaint Hand Pain    HPI Steven Hubbard is a 40 y.o. male patient were to right hand pain and swelling secondary hit a wall earlier today.Patient rates his pain as a 7/10. No palliative measures taken for this complaint. Patient is right-hand dominant. Patient denies loss of sensation or loss of function of the right hand.   Past Medical History:  Diagnosis Date  . Asthma     There are no active problems to display for this patient.   History reviewed. No pertinent surgical history.  Prior to Admission medications   Medication Sig Start Date End Date Taking? Authorizing Provider  brompheniramine-pseudoephedrine-DM 30-2-10 MG/5ML syrup Take 5 mLs by mouth 4 (four) times daily as needed. 12/07/15   Joni Reiningonald K Jenney Brester, PA-C  ibuprofen (ADVIL,MOTRIN) 800 MG tablet Take 1 tablet (800 mg total) by mouth every 8 (eight) hours as needed for moderate pain. 12/07/15   Joni Reiningonald K Swan Zayed, PA-C  ketorolac (TORADOL) 10 MG tablet Take 1 tablet (10 mg total) by mouth every 8 (eight) hours. 04/24/15   Jenise V Bacon Menshew, PA-C  naproxen (NAPROSYN) 500 MG tablet Take 1 tablet (500 mg total) by mouth 2 (two) times daily with a meal. 10/25/15   Joni Reiningonald K Danarius Mcconathy, PA-C  naproxen (NAPROSYN) 500 MG tablet Take 1 tablet (500 mg total) by mouth 2 (two) times daily with a meal. 05/31/16   Joni Reiningonald K Haruka Kowaleski, PA-C  penicillin v potassium (VEETID) 500 MG tablet Take 1 tablet (500 mg total) by mouth 4 (four) times daily. 04/24/15   Jenise V Bacon Menshew, PA-C  traMADol (ULTRAM) 50 MG tablet Take 1 tablet (50 mg total) by mouth every 6 (six) hours as needed for moderate pain. 10/25/15   Joni Reiningonald K Lynzi Meulemans, PA-C  traMADol (ULTRAM) 50 MG tablet Take 1 tablet  (50 mg total) by mouth every 6 (six) hours as needed. 10/30/15 10/29/16  Joni Reiningonald K Pearse Shiffler, PA-C  traMADol (ULTRAM) 50 MG tablet Take 1 tablet (50 mg total) by mouth every 6 (six) hours as needed for moderate pain. 05/31/16   Joni Reiningonald K Claudette Wermuth, PA-C    Allergies Review of patient's allergies indicates no known allergies.  No family history on file.  Social History Social History  Substance Use Topics  . Smoking status: Current Every Day Smoker    Packs/day: 0.25    Years: 1.00    Types: Cigarettes  . Smokeless tobacco: Never Used  . Alcohol use Yes     Comment: occasioanly    Review of Systems Constitutional: No fever/chills Eyes: No visual changes. ENT: No sore throat. Cardiovascular: Denies chest pain. Respiratory: Denies shortness of breath. Gastrointestinal: No abdominal pain.  No nausea, no vomiting.  No diarrhea.  No constipation. Genitourinary: Negative for dysuria. Musculoskeletal: Right hand pain  Skin: Negative for rash. Mild edema dorsal aspect of the fifth metacarpal Neurological: Negative for headaches, focal weakness or numbness.    ____________________________________________   PHYSICAL EXAM:  VITAL SIGNS: ED Triage Vitals  Enc Vitals Group     BP 05/31/16 1322 113/77     Pulse Rate 05/31/16 1322 84     Resp 05/31/16 1322 16     Temp 05/31/16 1322 97.6 F (36.4 C)     Temp Source  05/31/16 1322 Oral     SpO2 05/31/16 1322 95 %     Weight 05/31/16 1322 190 lb (86.2 kg)     Height 05/31/16 1322 5\' 8"  (1.727 m)     Head Circumference --      Peak Flow --      Pain Score 05/31/16 1321 7     Pain Loc --      Pain Edu? --      Excl. in GC? --     Constitutional: Alert and oriented. Well appearing and in no acute distress. Eyes: Conjunctivae are normal. PERRL. EOMI. Head: Atraumatic. Nose: No congestion/rhinnorhea. Mouth/Throat: Mucous membranes are moist.  Oropharynx non-erythematous. Neck: No stridor. No cervical spine tenderness to  palpation. Hematological/Lymphatic/Immunilogical: No cervical lymphadenopathy. Cardiovascular: Normal rate, regular rhythm. Grossly normal heart sounds.  Good peripheral circulation. Respiratory: Normal respiratory effort.  No retractions. Lungs CTAB. Gastrointestinal: Soft and nontender. No distention. No abdominal bruits. No CVA tenderness. Musculoskeletal: No obvious deformity to the right hand. There is mild edema  Neurologic:  Normal speech and language. No gross focal neurologic deficits are appreciated. No gait instability. Skin:  Skin is warm, dry and intact. No rash noted. Psychiatric: Mood and affect are normal. Speech and behavior are normal.  ____________________________________________   LABS (all labs ordered are listed, but only abnormal results are displayed)  Labs Reviewed - No data to display ____________________________________________  EKG   ____________________________________________  RADIOLOGY   ____________________________________________   PROCEDURES  Procedure(s) performed: None  Procedures  Critical Care performed: No  ____________________________________________   INITIAL IMPRESSION / ASSESSMENT AND PLAN / ED COURSE  Pertinent labs & imaging results that were available during my care of the patient were reviewed by me and considered in my medical decision making (see chart for details).  Right hand contusion. Discussed x-ray finding with patient. Patient given given discharge care instructions. Patient given a prescription for tramadol and naproxen. [facility]. Clinical Course     ____________________________________________   FINAL CLINICAL IMPRESSION(S) / ED DIAGNOSES  Final diagnoses:  Hand contusion, right, initial encounter      NEW MEDICATIONS STARTED DURING THIS VISIT:  New Prescriptions   NAPROXEN (NAPROSYN) 500 MG TABLET    Take 1 tablet (500 mg total) by mouth 2 (two) times  daily with a meal.   TRAMADOL (ULTRAM) 50 MG TABLET    Take 1 tablet (50 mg total) by mouth every 6 (six) hours as needed for moderate pain.     Note:  This document was prepared using Dragon voice recognition software and may include unintentional dictation errors.    Joni Reining, PA-C 05/31/16 1403    Loleta Rose, MD 05/31/16 [phone]

## 2016-07-17 ENCOUNTER — Encounter: Payer: Self-pay | Admitting: Emergency Medicine

## 2016-07-17 ENCOUNTER — Emergency Department: Payer: Self-pay

## 2016-07-17 ENCOUNTER — Emergency Department
Admission: EM | Admit: 2016-07-17 | Discharge: 2016-07-17 | Disposition: A | Discharge status: Home / Self Care | Payer: Self-pay | Attending: Emergency Medicine | Admitting: Emergency Medicine

## 2016-07-17 DIAGNOSIS — X58XXXA Exposure to other specified factors, initial encounter: Secondary | ICD-10-CM | POA: Insufficient documentation

## 2016-07-17 DIAGNOSIS — Y999 Unspecified external cause status: Secondary | ICD-10-CM | POA: Insufficient documentation

## 2016-07-17 DIAGNOSIS — S63501A Unspecified sprain of right wrist, initial encounter: Secondary | ICD-10-CM | POA: Insufficient documentation

## 2016-07-17 DIAGNOSIS — Y929 Unspecified place or not applicable: Secondary | ICD-10-CM | POA: Insufficient documentation

## 2016-07-17 DIAGNOSIS — J45909 Unspecified asthma, uncomplicated: Secondary | ICD-10-CM | POA: Insufficient documentation

## 2016-07-17 DIAGNOSIS — F17210 Nicotine dependence, cigarettes, uncomplicated: Secondary | ICD-10-CM | POA: Insufficient documentation

## 2016-07-17 DIAGNOSIS — Y939 Activity, unspecified: Secondary | ICD-10-CM | POA: Insufficient documentation

## 2016-07-17 DIAGNOSIS — Z79899 Other long term (current) drug therapy: Secondary | ICD-10-CM | POA: Insufficient documentation

## 2016-07-17 DIAGNOSIS — S60416A Abrasion of right little finger, initial encounter: Secondary | ICD-10-CM | POA: Insufficient documentation

## 2016-07-17 MED ORDER — NAPROXEN 500 MG PO TABS: 500.0000 mg | ORAL_TABLET | Freq: Two times a day (BID) | ORAL | 0 refills | Status: DC

## 2016-07-17 NOTE — ED Triage Notes (Signed)
Pt to ed with c/o right wrist pain x 3 weeks.  Pt states he was seen here 3 weeks ago for same after he hit a wall.  Pt states he has not reinjured it but pain is increased.

## 2016-07-17 NOTE — Discharge Instructions (Signed)
Wear splint for support and protection. Follow-up with Dr. Hyacinth MeekerMiller if any continued problems. Naproxen 500 mg twice a day with food.

## 2016-07-17 NOTE — ED Notes (Signed)
See triage note  States he had initial injury to right hand about 3 weeks ago.. Was dx'd with hand contusion  But has had intermittent sharp pain to hand since

## 2016-07-17 NOTE — ED Provider Notes (Signed)
[facility] Emergency Department Provider Note   ____________________________________________   First MD Initiated Contact with Patient 07/17/16 1055     (approximate)  I have reviewed the triage vital signs and the nursing notes.   HISTORY  Chief Complaint Wrist Pain   HPI Steven Hubbard is a 40 y.o. male is here with complaint of right hand and wrist pain. Patient states that he was seen in the emergency room for a right hand injury approximately 3 weeks ago. Patient states he was diagnosed with a hand contusion and prescribed medication. Patient states that he was also placed in a cock up Velcro splint which he did not wear and he has not taken the medication. 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 Patient continues to have pain in his wrist. He has not taken any over-the-counter medication and has not resumed use of his splint. Currently he rates his pain as 9/10.   Past Medical History:  Diagnosis Date  . Asthma     There are no active problems to display for this patient.   History reviewed. No pertinent surgical history.  Prior to Admission medications   Medication Sig Start Date End Date Taking? Authorizing Provider  brompheniramine-pseudoephedrine-DM 30-2-10 MG/5ML syrup Take 5 mLs by mouth 4 (four) times daily as needed. 12/07/15   Joni Reiningonald K Smith, PA-C  naproxen (NAPROSYN) 500 MG tablet Take 1 tablet (500 mg total) by mouth 2 (two) times daily with a meal. 07/17/16   Tommi Rumpshonda L Summers, PA-C    Allergies Review of patient's allergies indicates no known allergies.  History reviewed. No pertinent family history.  Social History Social History  Substance Use Topics  . Smoking status: Current Every Day Smoker    Packs/day: 0.25    Years: 1.00    Types: Cigarettes  . Smokeless tobacco: Never Used  . Alcohol use Yes     Comment: occasioanly      Review of Systems Constitutional: No fever/chills Cardiovascular: Denies chest pain. Respiratory: Denies shortness of breath. Gastrointestinal:   No nausea, no vomiting.  Musculoskeletal: Positive for right hand and wrist pain. Skin: Negative for rash. Neurological: Negative for headaches, focal weakness or numbness.  10-point ROS otherwise negative.  ____________________________________________   PHYSICAL EXAM:  VITAL SIGNS: ED Triage Vitals  Enc Vitals Group     BP 07/17/16 1033 (!) 117/91     Pulse Rate 07/17/16 1033 61     Resp 07/17/16 1033 18     Temp 07/17/16 1033 97.8 F (36.6 C)     Temp Source 07/17/16 1033 Oral     SpO2 07/17/16 1033 100 %     Weight 07/17/16 1034 190 lb (86.2 kg)     Height --      Head Circumference --      Peak Flow --      Pain Score 07/17/16 1034 9     Pain Loc --      Pain Edu? --      Excl. in GC? --     Constitutional: Alert and oriented. Well appearing and in no acute distress. Eyes: Conjunctivae are normal. PERRL. EOMI. Head: Atraumatic. Nose: No congestion/rhinnorhea. Neck: No stridor.   Cardiovascular: Normal rate, regular rhythm. Grossly normal heart sounds.  Good peripheral circulation. Respiratory: Normal respiratory effort.  No retractions. Lungs CTAB. Musculoskeletal: On examination of the wrist and hand  there is no gross deformity. There is a superficial abrasion to the fifth digit without active bleeding or signs of infection. Range of motion is restricted laterally secondary to pain. Patient is able to flex and extend without any difficulty. Neurologic:  Normal speech and language. No gross focal neurologic deficits are appreciated.  Skin:  Skin is warm, dry and intact. As noted above. Psychiatric: Mood and affect are normal. Speech and behavior are normal.  ____________________________________________   LABS (all labs ordered are listed, but only abnormal results are displayed)  Labs Reviewed - No data to  display  RADIOLOGY  Right wrist x-ray per radiologist negative. I, Tommi Rumps, personally viewed and evaluated these images (plain radiographs) as part of my medical decision making, as well as reviewing the written report by the radiologist. ____________________________________________   PROCEDURES  Procedure(s) performed: None  Procedures  Critical Care performed: No  ____________________________________________   INITIAL IMPRESSION / ASSESSMENT AND PLAN / ED COURSE  Pertinent labs & imaging results that were available during my care of the patient were reviewed by me and considered in my medical decision making (see chart for details).    Clinical Course  Patient is resumed use of his Velcro cock-up splint. He states these worked it out with his employer to be off for 3 days to be able to wear the splint. Patient was also given a prescription for naproxen 500 mg twice a day and he is encouraged to take it as directed. He is to follow-up with Dr. Hyacinth Meeker the orthopedist on call if any continued problems with his wrist.   ____________________________________________   FINAL CLINICAL IMPRESSION(S) / ED DIAGNOSES  Final diagnoses:  Sprain of right wrist, initial encounter      NEW MEDICATIONS STARTED DURING THIS VISIT:  Discharge Medication List as of 07/17/2016 12:06 PM       Note:  This document was prepared using Dragon voice recognition software and may include unintentional dictation errors.    Tommi Rumps, PA-C 07/17/16 1537    Emily Filbert, MD 07/18/16 [phone]

## 2016-08-30 ENCOUNTER — Emergency Department: Payer: Self-pay

## 2016-08-30 DIAGNOSIS — Y9383 Activity, rough housing and horseplay: Secondary | ICD-10-CM | POA: Insufficient documentation

## 2016-08-30 DIAGNOSIS — Y929 Unspecified place or not applicable: Secondary | ICD-10-CM | POA: Insufficient documentation

## 2016-08-30 DIAGNOSIS — J45909 Unspecified asthma, uncomplicated: Secondary | ICD-10-CM | POA: Insufficient documentation

## 2016-08-30 DIAGNOSIS — Z791 Long term (current) use of non-steroidal anti-inflammatories (NSAID): Secondary | ICD-10-CM | POA: Insufficient documentation

## 2016-08-30 DIAGNOSIS — Y999 Unspecified external cause status: Secondary | ICD-10-CM | POA: Insufficient documentation

## 2016-08-30 DIAGNOSIS — Z79899 Other long term (current) drug therapy: Secondary | ICD-10-CM | POA: Insufficient documentation

## 2016-08-30 DIAGNOSIS — F17210 Nicotine dependence, cigarettes, uncomplicated: Secondary | ICD-10-CM | POA: Insufficient documentation

## 2016-08-30 DIAGNOSIS — X501XXA Overexertion from prolonged static or awkward postures, initial encounter: Secondary | ICD-10-CM | POA: Insufficient documentation

## 2016-08-30 DIAGNOSIS — S82141A Displaced bicondylar fracture of right tibia, initial encounter for closed fracture: Secondary | ICD-10-CM | POA: Insufficient documentation

## 2016-08-30 NOTE — ED Triage Notes (Signed)
Pt in with co right knee pain states he "twisted" it today while "horse playing".

## 2016-08-31 ENCOUNTER — Emergency Department
Admission: EM | Admit: 2016-08-31 | Discharge: 2016-08-31 | Disposition: A | Discharge status: Home / Self Care | Payer: Self-pay | Attending: Emergency Medicine | Admitting: Emergency Medicine

## 2016-08-31 DIAGNOSIS — S82141A Displaced bicondylar fracture of right tibia, initial encounter for closed fracture: Secondary | ICD-10-CM

## 2016-08-31 MED ORDER — OXYCODONE-ACETAMINOPHEN 5-325 MG PO TABS
1.0000 | ORAL_TABLET | Freq: Once | ORAL | Status: AC
  Administered 2016-08-31: 1 via ORAL
  Filled 2016-08-31: qty 1

## 2016-08-31 MED ORDER — OXYCODONE-ACETAMINOPHEN 5-325 MG PO TABS: 1.0000 | ORAL_TABLET | ORAL | 0 refills | Status: DC | PRN

## 2016-08-31 NOTE — ED Provider Notes (Signed)
[facility] Emergency Department Provider Note    First MD Initiated Contact with Patient 08/31/16 [phone]     (approximate)  I have reviewed the triage vital signs and the nursing notes.   HISTORY  Chief Complaint Knee Pain  HPI Steven Hubbard is a 40 y.o. male presents with 8 out of 10 right knee pain which occurred today while "horseplaying". Patient states pain located to the lateral aspect of the right knee worse with ambulation and bearing weight on the right leg. She admits to previous history of an anterior cruciate ligament injury that did not require any surgical intervention.   Past Medical History:  Diagnosis Date  . Asthma     There are no active problems to display for this patient.   Past surgical history None  Prior to Admission medications   Medication Sig Start Date End Date Taking? Authorizing Provider  brompheniramine-pseudoephedrine-DM 30-2-10 MG/5ML syrup Take 5 mLs by mouth 4 (four) times daily as needed. 12/07/15   Joni Reiningonald K Smith, PA-C  naproxen (NAPROSYN) 500 MG tablet Take 1 tablet (500 mg total) by mouth 2 (two) times daily with a meal. 07/17/16   Tommi Rumpshonda L Summers, PA-C    Allergies Patient has no known allergies.  No family history on file.  Social History Social History  Substance Use Topics  . Smoking status: Current Every Day Smoker    Packs/day: 0.25    Years: 1.00    Types: Cigarettes  . Smokeless tobacco: Never Used  . Alcohol use Yes     Comment: occasioanly    Review of Systems Constitutional: No fever/chills Eyes: No visual changes. ENT: No sore throat. Cardiovascular: Denies chest pain. Respiratory: Denies shortness of breath. Gastrointestinal: No abdominal pain.  No nausea, no vomiting.  No diarrhea.  No constipation. Genitourinary: Negative for dysuria. Musculoskeletal: Negative for back pain.Positive for right knee pain Skin: Negative for rash. Neurological: Negative for headaches, focal  weakness or numbness.  10-point ROS otherwise negative.  ____________________________________________   PHYSICAL EXAM:  VITAL SIGNS: ED Triage Vitals  Enc Vitals Group     BP 08/30/16 2322 (!) 140/95     Pulse Rate 08/30/16 2322 81     Resp 08/30/16 2322 18     Temp 08/30/16 2322 98.3 F (36.8 C)     Temp Source 08/30/16 2322 Oral     SpO2 08/30/16 2322 97 %     Weight 08/30/16 2323 190 lb (86.2 kg)     Height 08/30/16 2323 5\' 8"  (1.727 m)     Head Circumference --      Peak Flow --      Pain Score 08/30/16 2323 8     Pain Loc --      Pain Edu? --      Excl. in GC? --     Constitutional: Alert and oriented. Well appearing and in no acute distress. Eyes: Conjunctivae are normal. PERRL. EOMI. Head: Atraumatic. Ears:  Healthy appearing ear canals and TMs bilaterally Nose: No congestion/rhinnorhea. Mouth/Throat: Mucous membranes are moist.  Oropharynx non-erythematous. Neck: No stridor.  No meningeal signs.  No cervical spine tenderness to palpation. Cardiovascular: Normal rate, regular rhythm. Good peripheral circulation. Grossly normal heart sounds. Respiratory: Normal respiratory effort.  No retractions. Lungs CTAB. Gastrointestinal: Soft and nontender. No distention.  Musculoskeletal: Pain to palpation lateral aspect of the right knee. Neurologic:  Normal speech and language. No gross focal neurologic deficits are appreciated.  Skin:  Skin is warm, dry  and intact. No rash noted. Psychiatric: Mood and affect are normal. Speech and behavior are normal.    RADIOLOGY I, Hobson N BROWN, personally viewed and evaluated these images (plain radiographs) as part of my medical decision making, as well as reviewing the written report by the radiologist.  Dg Knee Complete 4 Views Right  Result Date: 08/31/2016 CLINICAL DATA:  Acute onset of right knee pain after twisting injury. Initial encounter. EXAM: RIGHT KNEE - COMPLETE 4+ VIEW COMPARISON:  None. FINDINGS: There appears  to be a minimally depressed fracture involving the lateral tibial plateau, with up to 3 mm of depression. However, this is only seen on a single view. No significant degenerative change is seen; the patellofemoral joint is grossly unremarkable in appearance. No significant joint effusion is seen. The visualized soft tissues are normal in appearance. IMPRESSION: Apparent minimally depressed fracture involving the lateral tibial plateau, with up to 3 mm of depression. However, this is only seen on a single view. MRI could be considered for further evaluation, to assess for underlying trabecular bone injury or internal derangement. Electronically Signed   By: Roanna RaiderJeffery  Chang M.D.   On: 08/31/2016 00:02     Procedures     INITIAL IMPRESSION / ASSESSMENT AND PLAN / ED COURSE  Pertinent labs & imaging results that were available during my care of the patient were reviewed by me and considered in my medical decision making (see chart for details).  Knee immobilizer applied to the right knee and crutches given. Patient given Percocet will be prescribed same for home.  Patient referred to orthopedic surgery   Clinical Course     ____________________________________________  FINAL CLINICAL IMPRESSION(S) / ED DIAGNOSES  Final diagnoses:  Tibial plateau fracture, right, closed, initial encounter     MEDICATIONS GIVEN DURING THIS VISIT:  Medications - No data to display   NEW OUTPATIENT MEDICATIONS STARTED DURING THIS VISIT:  New Prescriptions   No medications on file    Modified Medications   No medications on file    Discontinued Medications   No medications on file     Note:  This document was prepared using Dragon voice recognition software and may include unintentional dictation errors.    [name], MD 09/01/16 [phone]

## 2017-01-05 ENCOUNTER — Emergency Department
Admission: EM | Admit: 2017-01-05 | Discharge: 2017-01-06 | Disposition: A | Discharge status: Home / Self Care | Payer: Self-pay | Attending: Emergency Medicine | Admitting: Emergency Medicine

## 2017-01-05 ENCOUNTER — Emergency Department: Payer: Self-pay

## 2017-01-05 ENCOUNTER — Encounter: Payer: Self-pay | Admitting: Emergency Medicine

## 2017-01-05 DIAGNOSIS — N433 Hydrocele, unspecified: Secondary | ICD-10-CM | POA: Insufficient documentation

## 2017-01-05 DIAGNOSIS — Z79899 Other long term (current) drug therapy: Secondary | ICD-10-CM | POA: Insufficient documentation

## 2017-01-05 DIAGNOSIS — F17210 Nicotine dependence, cigarettes, uncomplicated: Secondary | ICD-10-CM | POA: Insufficient documentation

## 2017-01-05 DIAGNOSIS — J45909 Unspecified asthma, uncomplicated: Secondary | ICD-10-CM | POA: Insufficient documentation

## 2017-01-05 DIAGNOSIS — R1909 Other intra-abdominal and pelvic swelling, mass and lump: Secondary | ICD-10-CM

## 2017-01-05 LAB — URINALYSIS, ROUTINE W REFLEX MICROSCOPIC
BILIRUBIN URINE: NEGATIVE
GLUCOSE, UA: NEGATIVE mg/dL
HGB URINE DIPSTICK: NEGATIVE
Ketones, ur: NEGATIVE mg/dL
Leukocytes, UA: NEGATIVE
Nitrite: NEGATIVE
Protein, ur: NEGATIVE mg/dL
SPECIFIC GRAVITY, URINE: 1.021 (ref 1.005–1.030)
pH: 6 (ref 5.0–8.0)

## 2017-01-05 NOTE — ED Provider Notes (Signed)
[facility] Emergency Department Provider Note   ____________________________________________   I have reviewed the triage vital signs and the nursing notes.   HISTORY  Chief Complaint Groin Pain   History limited by: Not Limited   HPI Steven Hubbard is a 41 y.o. male who presents to the emergency department today cousin concerns for right inguinal lump and discomfort. The patient states he knows of for the past week. States has not really changed in size. He did not do any unusual lifting although does lift some for his work. He states that it is not even that painful or uncomfortable. He has not noticed any change or difficulty with urination or defecation. Denies similar symptoms in the past. Denies any fevers nausea or vomiting.   Past Medical History:  Diagnosis Date  . Asthma     There are no active problems to display for this patient.   History reviewed. No pertinent surgical history.  Prior to Admission medications   Medication Sig Start Date End Date Taking? Authorizing Provider  brompheniramine-pseudoephedrine-DM 30-2-10 MG/5ML syrup Take 5 mLs by mouth 4 (four) times daily as needed. 12/07/15   Joni Reining, PA-C  naproxen (NAPROSYN) 500 MG tablet Take 1 tablet (500 mg total) by mouth 2 (two) times daily with a meal. 07/17/16   Tommi Rumps, PA-C  oxyCODONE-acetaminophen (ROXICET) 5-325 MG tablet Take 1 tablet by mouth every 4 (four) hours as needed for severe pain. 08/31/16   Darci Current, MD    Allergies Patient has no known allergies.  History reviewed. No pertinent family history.  Social History Social History  Substance Use Topics  . Smoking status: Current Every Day Smoker    Packs/day: 0.25    Years: 1.00    Types: Cigarettes  . Smokeless tobacco: Never Used  . Alcohol use Yes     Comment: occasioanly    Review of Systems  Constitutional: Negative for fever. Cardiovascular: Negative for chest  pain. Respiratory: Negative for shortness of breath. Gastrointestinal: Negative for abdominal pain, vomiting and diarrhea. Genitourinary: Negative for dysuria. Neurological: Negative for headaches, focal weakness or numbness.  10-point ROS otherwise negative.  ____________________________________________   PHYSICAL EXAM:  VITAL SIGNS: ED Triage Vitals [01/05/17 2041]  Enc Vitals Group     BP 126/80     Pulse Rate (!) 101     Resp 18     Temp 98.2 F (36.8 C)     Temp Source Oral     SpO2 97 %     Weight 200 lb (90.7 kg)     Height  (1.727 m)     Head Circumference      Peak Flow      Pain Score 0   Constitutional: Alert and oriented. Well appearing and in no distress. Eyes: Conjunctivae are normal. Normal extraocular movements. ENT   Head: Normocephalic and atraumatic.   Nose: No congestion/rhinnorhea.   Mouth/Throat: Mucous membranes are moist.   Neck: No stridor. Cardiovascular: Normal rate, regular rhythm.  Respiratory: Normal respiratory effort without tachypnea nor retractions.  Genitourinary: Deferred Musculoskeletal: Normal range of motion in all extremities. No lower extremity edema. Neurologic:  Normal speech and language. No gross focal neurologic deficits are appreciated.  Skin:  Swelling noted to right groin, somewhat firm. No overlying skin color change or erythema.  Psychiatric: Mood and affect are normal. Speech and behavior are normal. Patient exhibits appropriate insight and judgment.  ____________________________________________    LABS (pertinent positives/negatives)  Labs Reviewed  URINALYSIS, ROUTINE W REFLEX MICROSCOPIC - Abnormal; Notable for the following:       Result Value   Color, Urine YELLOW (*)    APPearance HAZY (*)    All other components within normal limits     ____________________________________________   EKG  None  ____________________________________________    RADIOLOGY  US IMPRESSION:   Avascular simple cystic lesion in the right groin measuring up to  6.6 cm. 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 Differential considerations include lymphatic  malformation, inclusion cyst, or possibly chronic hematoma.      ____________________________________________   PROCEDURES  Procedures  ____________________________________________   INITIAL IMPRESSION / ASSESSMENT AND PLAN / ED COURSE  Pertinent labs & imaging results that were available during my care of the patient were reviewed by me and considered in my medical decision making (see chart for details).  Patient presents to the emergency department today because of concerns for right inguinal swelling. Ultrasound is consistent with a spermatic cord hydrocele. Discussed this finding with patient. Will give patient urology follow-up information.  ____________________________________________   FINAL CLINICAL IMPRESSION(S) / ED DIAGNOSES  Final diagnoses:  Inguinal swelling  Hydrocele of spermatic cord     Note: This dictation was prepared with Dragon dictation. Any transcriptional errors that result from this process are unintentional     Phineas Semen, MD 01/06/17 [phone]

## 2017-01-05 NOTE — ED Triage Notes (Signed)
Pt states several weeks of right groin pain and swelling. Pt describes as a "lump", "like a swollen lymph node or a hernia". Pt denies difficulty urinating, fever, discharge from penis. Pt appears in no acute distress.

## 2017-01-05 NOTE — ED Notes (Signed)
Patient transported to Ultrasound 

## 2017-01-06 NOTE — Discharge Instructions (Signed)
Please seek medical attention for any high fevers, chest pain, shortness of breath, change in behavior, persistent vomiting, bloody stool or any other new or concerning symptoms.  

## 2017-01-06 NOTE — ED Notes (Signed)
Reviewed d/c instructions, follow-up care with patient. Pt verbalized understanding.  

## 2017-06-24 ENCOUNTER — Encounter: Payer: Self-pay | Admitting: Emergency Medicine

## 2017-06-24 ENCOUNTER — Emergency Department
Admission: EM | Admit: 2017-06-24 | Discharge: 2017-06-24 | Disposition: A | Discharge status: Home / Self Care | Payer: Self-pay | Attending: Emergency Medicine | Admitting: Emergency Medicine

## 2017-06-24 ENCOUNTER — Emergency Department: Payer: Self-pay

## 2017-06-24 DIAGNOSIS — F17210 Nicotine dependence, cigarettes, uncomplicated: Secondary | ICD-10-CM | POA: Insufficient documentation

## 2017-06-24 DIAGNOSIS — M25562 Pain in left knee: Secondary | ICD-10-CM | POA: Insufficient documentation

## 2017-06-24 DIAGNOSIS — M25561 Pain in right knee: Secondary | ICD-10-CM | POA: Insufficient documentation

## 2017-06-24 DIAGNOSIS — Z5321 Procedure and treatment not carried out due to patient leaving prior to being seen by health care provider: Secondary | ICD-10-CM | POA: Insufficient documentation

## 2017-06-24 DIAGNOSIS — J45909 Unspecified asthma, uncomplicated: Secondary | ICD-10-CM | POA: Insufficient documentation

## 2017-06-24 MED ORDER — MELOXICAM 7.5 MG PO TABS
15.0000 mg | ORAL_TABLET | Freq: Every day | ORAL | Status: DC
  Administered 2017-06-24: 15 mg via ORAL

## 2017-06-24 MED ORDER — MELOXICAM 7.5 MG PO TABS: 7.5000 mg | ORAL_TABLET | Freq: Every day | ORAL | 1 refills | Status: AC

## 2017-06-24 MED ORDER — MELOXICAM 7.5 MG PO TABS
ORAL_TABLET | ORAL | Status: AC
  Administered 2017-06-24: 15 mg via ORAL
  Filled 2017-06-24: qty 2

## 2017-06-24 NOTE — ED Provider Notes (Signed)
[facility] Emergency Department Provider Note  ____________________________________________  Time seen: Approximately 10:36 PM  I have reviewed the triage vital signs and the nursing notes.   HISTORY  Chief Complaint Knee Injury    HPI Steven Hubbard is a 41 y.o. male presenting to the emergency department with 10 out of 10 left knee pain for two days. Patient reports that he was "roughhousing" with his children last night and suddenly experienced pain. Patient denies hearing a popping or cracking sensation. Patient denies falls or other mechanisms of trauma. He denies weakness, radiculopathy or changes in sensation of the left lower extremity. No skin compromise. Patient states that his pain is worsened with climbing stairs and flexion at the knee. No alleviating measures have been attempted.   Past Medical History:  Diagnosis Date  . Asthma     There are no active problems to display for this patient.   No past surgical history on file.  Prior to Admission medications   Medication Sig Start Date End Date Taking? Authorizing Provider  brompheniramine-pseudoephedrine-DM 30-2-10 MG/5ML syrup Take 5 mLs by mouth 4 (four) times daily as needed. 12/07/15   Joni Reining, PA-C  meloxicam (MOBIC) 7.5 MG tablet Take 1 tablet (7.5 mg total) by mouth daily. 06/24/17 07/01/17  Orvil Feil, PA-C  naproxen (NAPROSYN) 500 MG tablet Take 1 tablet (500 mg total) by mouth 2 (two) times daily with a meal. 07/17/16   Tommi Rumps, PA-C  oxyCODONE-acetaminophen (ROXICET) 5-325 MG tablet Take 1 tablet by mouth every 4 (four) hours as needed for severe pain. 08/31/16   Darci Current, MD    Allergies Patient has no known allergies.  No family history on file.  Social History Social History  Substance Use Topics  . Smoking status: Current Every Day Smoker    Packs/day: 0.25    Years: 1.00    Types: Cigarettes  . Smokeless tobacco: Never Used  . Alcohol use  Yes     Comment: occasioanly     Review of Systems  Constitutional: No fever/chills Cardiovascular: no chest pain. Respiratory: no cough. No SOB. Musculoskeletal: Patient has left knee pain.  Skin: Negative for rash, abrasions, lacerations, ecchymosis. Neurological: Negative for headaches, focal weakness or numbness.   ____________________________________________   PHYSICAL EXAM:  VITAL SIGNS: ED Triage Vitals  Enc Vitals Group     BP 06/24/17 2046 116/78     Pulse Rate 06/24/17 2046 78     Resp 06/24/17 2046 18     Temp 06/24/17 2046 98.2 F (36.8 C)     Temp Source 06/24/17 2046 Oral     SpO2 06/24/17 2046 99 %     Weight 06/24/17 2046 185 lb (83.9 kg)     Height 06/24/17 2046  (1.727 m)     Head Circumference --      Peak Flow --      Pain Score 06/24/17 2045 4     Pain Loc --      Pain Edu? --      Excl. in GC? --      Constitutional: Alert and oriented. Well appearing and in no acute distress. Eyes: Conjunctivae are normal. PERRL. EOMI. Head: Atraumatic. Cardiovascular: Normal rate, regular rhythm. Normal S1 and S2.  Good peripheral circulation. Respiratory: Normal respiratory effort without tachypnea or retractions. Lungs CTAB. Good air entry to the bases with no decreased or absent breath sounds. Musculoskeletal: Full range of motion of the left hip and  left ankle. Left knee: Patient has pain performing flexion at the knee. Negative anterior and posterior drawer test. No laxity MCL or LCL testing. Patient has pain with palpation of the medial joint line. Negative ballottement. Peripatellar dimpling visualized. Palpable dorsalis pedis pulses Neurologic:  Normal speech and language. No gross focal neurologic deficits are appreciated.  Skin:  Skin is warm, dry and intact. No rash noted. Psychiatric: Mood and affect are normal. Speech and behavior are normal. Patient exhibits appropriate insight and judgement.   ____________________________________________    LABS (all labs ordered are listed, but only abnormal results are displayed)  Labs Reviewed - No data to display ____________________________________________  EKG   ____________________________________________  RADIOLOGY Geraldo Pitter, personally viewed and evaluated these images (plain radiographs) as part of my medical decision making, as well as reviewing the written report by the radiologist.  Dg Knee Complete 4 Views Left  Result Date: 06/24/2017 CLINICAL DATA:  Left knee pain after twisting injury last night. EXAM: LEFT KNEE - COMPLETE 4+ VIEW COMPARISON:  None. FINDINGS: No evidence of fracture, dislocation, or joint effusion. No evidence of arthropathy or other focal bone abnormality. Soft tissues are unremarkable. IMPRESSION: Negative. Electronically Signed   By: Elberta Fortis M.D.   On: 06/24/2017 21:43    ____________________________________________    PROCEDURES  Procedure(s) performed:    Procedures    Medications  meloxicam (MOBIC) tablet 15 mg (not administered)     ____________________________________________   INITIAL IMPRESSION / ASSESSMENT AND PLAN / ED COURSE  Pertinent labs & imaging results that were available during my care of the patient were reviewed by me and considered in my medical decision making (see chart for details).  Review of the  [place] was performed in accordance of the [place] prior to dispensing any controlled drugs.    Assessment and plan Knee pain Patient presents to the emergency department with left knee pain for the past 2 nights. X-ray examination reveals no acute fractures or bony abnormalities. A knee immobilizer was applied in the emergency department. Patient was discharged with a meloxicam and a referral to orthopedics was given. A work note was provided. All patient questions were answered.  ____________________________________________  FINAL CLINICAL IMPRESSION(S) / ED DIAGNOSES  Final diagnoses:  Acute pain  of left knee      NEW MEDICATIONS STARTED DURING THIS VISIT:  New Prescriptions   MELOXICAM (MOBIC) 7.5 MG TABLET    Take 1 tablet (7.5 mg total) by mouth daily.        This chart was dictated using voice recognition software/Dragon. Despite best efforts to proofread, errors can occur which can change the meaning. Any change was purely unintentional.    Orvil Feil, PA-C 06/24/17 2316    Sharman Cheek, MD 06/28/17 1531

## 2017-06-24 NOTE — ED Notes (Signed)
Pt c/o knee pain since 12am last night, pt states he was playing and twisted his left knee. Pt has hx of knee pain. Pt states the pain is stabbing and grinding. Pt states he has not tried any medications at home for pain relief.

## 2017-06-24 NOTE — ED Notes (Addendum)
Hannah from [facility] states she went to room to do knee XR and pt was not in the room, prior to going in the room when she was with another pt overheard him say that he had to pick someone up.  Per [place] PA, she ordered an XR, but did not see the patient. This RN did not see the patient leave, but [name] did hear him say he was leaving. LWBS.

## 2017-06-24 NOTE — ED Notes (Signed)
Pt c/o left knee pain - he was playing around last night and twisted his knee between a dresser and the wall - he states the pain is only present when he bends his knee - no redness or swelling noted at this time

## 2017-06-24 NOTE — ED Triage Notes (Signed)
Pt reports rough housing last night and injured his left knee, pt reports sharp pain if he attempts to bend it, pt ambulatory to triage while keeping his left leg straight

## 2017-06-24 NOTE — ED Triage Notes (Signed)
Pt states that he is having right knee pain that started last night around midnight. Pt is able to ambulate.

## 2017-07-23 ENCOUNTER — Emergency Department (HOSPITAL_COMMUNITY): Payer: No Typology Code available for payment source

## 2017-07-23 ENCOUNTER — Encounter (HOSPITAL_COMMUNITY): Payer: Self-pay

## 2017-07-23 ENCOUNTER — Emergency Department (HOSPITAL_COMMUNITY)
Admission: EM | Admit: 2017-07-23 | Discharge: 2017-07-23 | Disposition: A | Discharge status: Home / Self Care | Payer: No Typology Code available for payment source | Attending: Emergency Medicine | Admitting: Emergency Medicine

## 2017-07-23 DIAGNOSIS — Y999 Unspecified external cause status: Secondary | ICD-10-CM | POA: Insufficient documentation

## 2017-07-23 DIAGNOSIS — J45909 Unspecified asthma, uncomplicated: Secondary | ICD-10-CM | POA: Insufficient documentation

## 2017-07-23 DIAGNOSIS — M25551 Pain in right hip: Secondary | ICD-10-CM | POA: Diagnosis not present

## 2017-07-23 DIAGNOSIS — Z79899 Other long term (current) drug therapy: Secondary | ICD-10-CM | POA: Insufficient documentation

## 2017-07-23 DIAGNOSIS — M62830 Muscle spasm of back: Secondary | ICD-10-CM

## 2017-07-23 DIAGNOSIS — Y92410 Unspecified street and highway as the place of occurrence of the external cause: Secondary | ICD-10-CM | POA: Insufficient documentation

## 2017-07-23 DIAGNOSIS — F17210 Nicotine dependence, cigarettes, uncomplicated: Secondary | ICD-10-CM | POA: Insufficient documentation

## 2017-07-23 DIAGNOSIS — Y9389 Activity, other specified: Secondary | ICD-10-CM | POA: Diagnosis not present

## 2017-07-23 DIAGNOSIS — S79911A Unspecified injury of right hip, initial encounter: Secondary | ICD-10-CM | POA: Diagnosis present

## 2017-07-23 MED ORDER — METHOCARBAMOL 500 MG PO TABS: 500.0000 mg | ORAL_TABLET | Freq: Three times a day (TID) | ORAL | 0 refills | Status: AC

## 2017-07-23 MED ORDER — ACETAMINOPHEN 500 MG PO TABS
1000.0000 mg | ORAL_TABLET | Freq: Once | ORAL | Status: DC
  Filled 2017-07-23: qty 2

## 2017-07-23 MED ORDER — KETOROLAC TROMETHAMINE 30 MG/ML IJ SOLN
30.0000 mg | Freq: Once | INTRAMUSCULAR | Status: DC
  Filled 2017-07-23: qty 1
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
## 2017-08-24 ENCOUNTER — Emergency Department
Admission: EM | Admit: 2017-08-24 | Discharge: 2017-08-24 | Disposition: A | Discharge status: Home / Self Care | Payer: Self-pay | Attending: Emergency Medicine | Admitting: Emergency Medicine

## 2017-08-24 ENCOUNTER — Other Ambulatory Visit: Payer: Self-pay

## 2017-08-24 ENCOUNTER — Encounter: Payer: Self-pay | Admitting: Emergency Medicine

## 2017-08-24 DIAGNOSIS — R197 Diarrhea, unspecified: Secondary | ICD-10-CM | POA: Insufficient documentation

## 2017-08-24 DIAGNOSIS — B349 Viral infection, unspecified: Secondary | ICD-10-CM | POA: Insufficient documentation

## 2017-08-24 DIAGNOSIS — F17210 Nicotine dependence, cigarettes, uncomplicated: Secondary | ICD-10-CM | POA: Insufficient documentation

## 2017-08-24 DIAGNOSIS — J45909 Unspecified asthma, uncomplicated: Secondary | ICD-10-CM | POA: Insufficient documentation

## 2017-08-24 DIAGNOSIS — R110 Nausea: Secondary | ICD-10-CM | POA: Insufficient documentation

## 2017-08-24 DIAGNOSIS — M791 Myalgia, unspecified site: Secondary | ICD-10-CM | POA: Insufficient documentation

## 2017-08-24 DIAGNOSIS — Z79899 Other long term (current) drug therapy: Secondary | ICD-10-CM | POA: Insufficient documentation

## 2017-08-24 NOTE — Discharge Instructions (Signed)
Follow-up with [facility] acute-care if any continued problems. Begin clear liquids for the next 24 hours. Tylenol or ibuprofen if needed for body aches or fever. Obtain Claritin D or Zyrtec D at the pharmacy.  This is the kind that you will need to sign for.

## 2017-08-24 NOTE — ED Triage Notes (Signed)
C/O sinus congestion and nausea x 2-3 days.  Also has noticed wheezing in the mornings.  Denies fevers.

## 2017-08-24 NOTE — ED Provider Notes (Signed)
[facility] Emergency Department Provider Note  ____________________________________________   First MD Initiated Contact with Patient 08/24/17 1449     (approximate)  I have reviewed the triage vital signs and the nursing notes.   HISTORY  Chief Complaint sinus congestion and Nausea   HPI Steven Hubbard is a 41 y.o. male is here with  Complaining of sinus congestion with some nausea for the last 2-3 days. Patient states that he is also had diarrhea 3 times in last 24 hours. He denies any fever but feels that he has had chills. He denies cough. He states that he wheezes only in the mornings. Patient is a smoker. He denies any prior problems with sinusitis or bronchitis.   Past Medical History:  Diagnosis Date  . Asthma     There are no active problems to display for this patient.   History reviewed. No pertinent surgical history.  Prior to Admission medications   Medication Sig Start Date End Date Taking? Authorizing Provider  acetaminophen (TYLENOL) 500 MG tablet Take 1,000 mg by mouth every 4 (four) hours as needed for moderate pain.    [provider]  ibuprofen (ADVIL,MOTRIN) 200 MG tablet Take 800 mg by mouth every 4 (four) hours as needed for moderate pain.    [provider]    Allergies Patient has no known allergies.  No family history on file.  Social History Social History   Tobacco Use  . Smoking status: Current Every Day Smoker    Packs/day: 0.15    Years: 1.00    Pack years: 0.15    Types: Cigarettes  . Smokeless tobacco: Never Used  Substance Use Topics  . Alcohol use: Yes    Comment: occasioanly  . Drug use: Yes    Types: Marijuana    Comment: occasionally    Review of Systems Constitutional: No fever/ positive chills Eyes: No visual changes. ENT: No sore throat.positive sinus congestion. Cardiovascular: Denies chest pain. Respiratory: Denies shortness of breath. Gastrointestinal: No abdominal  pain.  positive nausea, no vomiting.  positive diarrhea.   Genitourinary: Negative for dysuria. Musculoskeletal: positive for muscle aches. Skin: Negative for rash. Neurological: Negative for headaches, focal weakness or numbness. ____________________________________________   PHYSICAL EXAM:  VITAL SIGNS: ED Triage Vitals  Enc Vitals Group     BP 08/24/17 1402 (!) 144/80     Pulse Rate 08/24/17 1402 85     Resp 08/24/17 1402 16     Temp 08/24/17 1402 98 F (36.7 C)     Temp Source 08/24/17 1402 Oral     SpO2 08/24/17 1402 99 %     Weight 08/24/17 1403 190 lb (86.2 kg)     Height 08/24/17 1403 5\' 8"  (1.727 m)     Head Circumference --      Peak Flow --      Pain Score 08/24/17 1402 0     Pain Loc --      Pain Edu? --      Excl. in GC? --    Constitutional: Alert and oriented. Well appearing and in no acute distress. Eyes: Conjunctivae are normal.  Head: Atraumatic. Nose: positive congestion/rhinnorhea.  EACs and TMs are clear. Nontender sinuses to percussion. Mouth/Throat: Mucous membranes are moist.  Oropharynx non-erythematous. Neck: No stridor.   Hematological/Lymphatic/Immunilogical: No cervical lymphadenopathy. Cardiovascular: Normal rate, regular rhythm. Grossly normal heart sounds.  Good peripheral circulation. Respiratory: Normal respiratory effort.  No retractions. Lungs CTAB. Gastrointestinal: Soft and nontender. No distention.  Musculoskeletal: moves upper and lower extremities without difficulty and normal gait was noted. Neurologic:  Normal speech and language. No gross focal neurologic deficits are appreciated.  Skin:  Skin is warm, dry and intact.  Psychiatric: Mood and affect are normal. Speech and behavior are normal.  ____________________________________________   LABS (all labs ordered are listed, but only abnormal results are displayed)  Labs Reviewed - No data to display  PROCEDURES  Procedure(s) performed: None  Procedures  Critical Care  performed: No  ____________________________________________   INITIAL IMPRESSION / ASSESSMENT AND PLAN / ED COURSE Patient is given instructions to obtain either Zyrtec-D or Claritin-D at the pharmacy. He is also to remain on clear liquids for the next 24 hours to avoid continued diarrhea. He states at this time he does not have any nausea. He was given information about viral illnesses. He will continue Tylenol or ibuprofen as needed for fever or chills. He'll follow-up with [facility] if any continued problems. ____________________________________________   FINAL CLINICAL IMPRESSION(S) / ED DIAGNOSES  Final diagnoses:  Viral illness     ED Discharge Orders    None       Note:  This document was prepared using Dragon voice recognition software and may include unintentional dictation errors.    Tommi RumpsSummers, Aleya Durnell L, PA-C 08/24/17 1634    Emily FilbertWilliams, Jonathan E, MD 08/25/17 [id number]

## 2017-08-27 ENCOUNTER — Other Ambulatory Visit: Payer: Self-pay

## 2017-08-27 ENCOUNTER — Encounter: Payer: Self-pay | Admitting: Emergency Medicine

## 2017-08-27 ENCOUNTER — Emergency Department
Admission: EM | Admit: 2017-08-27 | Discharge: 2017-08-27 | Disposition: A | Discharge status: Home / Self Care | Payer: Self-pay | Attending: Emergency Medicine | Admitting: Emergency Medicine

## 2017-08-27 DIAGNOSIS — J45909 Unspecified asthma, uncomplicated: Secondary | ICD-10-CM | POA: Insufficient documentation

## 2017-08-27 DIAGNOSIS — J069 Acute upper respiratory infection, unspecified: Secondary | ICD-10-CM | POA: Insufficient documentation

## 2017-08-27 DIAGNOSIS — F17210 Nicotine dependence, cigarettes, uncomplicated: Secondary | ICD-10-CM | POA: Insufficient documentation

## 2017-08-27 DIAGNOSIS — J029 Acute pharyngitis, unspecified: Secondary | ICD-10-CM | POA: Insufficient documentation

## 2017-08-27 MED ORDER — AMOXICILLIN 500 MG PO CAPS: 500.0000 mg | ORAL_CAPSULE | Freq: Three times a day (TID) | ORAL | 0 refills | Status: DC

## 2017-08-27 NOTE — Discharge Instructions (Signed)
Follow up with your regular doctor or the acute care if you are not better in 3-5 days, use the medication as prescribed, gargle with warm salt water, use over-the-counter cold medicines as needed, if you develop nausea or vomiting he may use ginger ale/crackers to help control the nausea

## 2017-08-27 NOTE — ED Triage Notes (Signed)
Presents with sinus pressure and cough  Sx's started last week  Unsure of fever  Cough has been occasionally prod

## 2017-08-27 NOTE — ED Provider Notes (Signed)
[facility] Emergency Department Provider Note  ____________________________________________   First MD Initiated Contact with Patient 08/27/17 1052     (approximate)  I have reviewed the triage vital signs and the nursing notes.   HISTORY  Chief Complaint Cough and Sore Throat    HPI Steven Hubbard is a 41 y.o. male complains of cough, congestion, sore throat, some nausea, states his skin hurts, denies fever, chills, chest pain, or shortness of breath patient was seen last week in the emergency department effort his papers that he needs another work note   Past Medical History:  Diagnosis Date  . Asthma     There are no active problems to display for this patient.   History reviewed. No pertinent surgical history.  Prior to Admission medications   Medication Sig Start Date End Date Taking? Authorizing Provider  acetaminophen (TYLENOL) 500 MG tablet Take 1,000 mg by mouth every 4 (four) hours as needed for moderate pain.    [provider]  amoxicillin (AMOXIL) 500 MG capsule Take 1 capsule (500 mg total) by mouth 3 (three) times daily. 08/27/17   Macon Lesesne, Roselyn BeringSusan W, PA-C  ibuprofen (ADVIL,MOTRIN) 200 MG tablet Take 800 mg by mouth every 4 (four) hours as needed for moderate pain.    [provider]    Allergies Patient has no known allergies.  No family history on file.  Social History Social History   Tobacco Use  . Smoking status: Current Every Day Smoker    Packs/day: 0.15    Years: 1.00    Pack years: 0.15    Types: Cigarettes  . Smokeless tobacco: Never Used  Substance Use Topics  . Alcohol use: Yes    Comment: occasioanly  . Drug use: Yes    Types: Marijuana    Comment: occasionally    Review of Systems  Constitutional: No fever/chills Eyes: No visual changes. ENT: Positive sore throat. Respiratory: Positive cough Genitourinary: Negative for dysuria. Musculoskeletal: Negative for back pain. Skin:  Negative for rash.    ____________________________________________   PHYSICAL EXAM:  VITAL SIGNS: ED Triage Vitals  Enc Vitals Group     BP 08/27/17 1020 (!) 166/88     Pulse Rate 08/27/17 1020 90     Resp 08/27/17 1020 18     Temp 08/27/17 1020 (!) 97.2 F (36.2 C)     Temp Source 08/27/17 1020 Oral     SpO2 08/27/17 1020 99 %     Weight 08/27/17 1014 190 lb (86.2 kg)     Height 08/27/17 1014 5\' 8"  (1.727 m)     Head Circumference --      Peak Flow --      Pain Score --      Pain Loc --      Pain Edu? --      Excl. in GC? --     Constitutional: Alert and oriented. Well appearing and in no acute distress. Eyes: Conjunctivae are normal.  Head: Atraumatic. Nose: No congestion/rhinnorhea. Mouth/Throat: Mucous membranes are moist.  Throat is red and swollen Cardiovascular: Normal rate, regular rhythm. Respiratory: Normal respiratory effort.  No retractions GU: deferred Musculoskeletal: FROM all extremities, warm and well perfused Neurologic:  Normal speech and language.  Skin:  Skin is warm, dry and intact. No rash noted. Psychiatric: Mood and affect are normal. Speech and behavior are normal.  ____________________________________________   LABS (all labs ordered are listed, but only abnormal results are displayed)  Labs Reviewed - No  data to display ____________________________________________   ____________________________________________  RADIOLOGY   ____________________________________________   PROCEDURES  Procedure(s) performed: No      ____________________________________________   INITIAL IMPRESSION / ASSESSMENT AND PLAN / ED COURSE  Pertinent labs & imaging results that were available during my care of the patient were reviewed by me and considered in my medical decision making (see chart for details).  This is patient's second time in the emergency department for the same complaint, patient appears well, needs work note for multiple  days, certainly is red cell prescription for Amoxil 500 mg 3 times a day was given, work note was given      ____________________________________________   FINAL CLINICAL IMPRESSION(S) / ED DIAGNOSES  Final diagnoses:  Upper respiratory tract infection, unspecified type      NEW MEDICATIONS STARTED DURING THIS VISIT:  This SmartLink is deprecated. Use AVSMEDLIST instead to display the medication list for a patient.   Note:  This document was prepared using Dragon voice recognition software and may include unintentional dictation errors.    Faythe GheeFisher, Mickenzie Stolar W, PA-C 08/27/17 1158    Jene EveryKinner, Robert, MD 08/27/17 [phone]

## 2017-10-28 ENCOUNTER — Emergency Department (HOSPITAL_COMMUNITY): Payer: Self-pay

## 2017-10-28 ENCOUNTER — Emergency Department (HOSPITAL_COMMUNITY)
Admission: EM | Admit: 2017-10-28 | Discharge: 2017-10-28 | Disposition: A | Discharge status: Home / Self Care | Payer: Self-pay | Attending: Emergency Medicine | Admitting: Emergency Medicine

## 2017-10-28 ENCOUNTER — Encounter (HOSPITAL_COMMUNITY): Payer: Self-pay

## 2017-10-28 DIAGNOSIS — Y929 Unspecified place or not applicable: Secondary | ICD-10-CM | POA: Insufficient documentation

## 2017-10-28 DIAGNOSIS — J45909 Unspecified asthma, uncomplicated: Secondary | ICD-10-CM | POA: Insufficient documentation

## 2017-10-28 DIAGNOSIS — X500XXA Overexertion from strenuous movement or load, initial encounter: Secondary | ICD-10-CM | POA: Insufficient documentation

## 2017-10-28 DIAGNOSIS — S8391XA Sprain of unspecified site of right knee, initial encounter: Secondary | ICD-10-CM | POA: Insufficient documentation

## 2017-10-28 DIAGNOSIS — F17210 Nicotine dependence, cigarettes, uncomplicated: Secondary | ICD-10-CM | POA: Insufficient documentation

## 2017-10-28 DIAGNOSIS — Y9383 Activity, rough housing and horseplay: Secondary | ICD-10-CM | POA: Insufficient documentation

## 2017-10-28 DIAGNOSIS — Y999 Unspecified external cause status: Secondary | ICD-10-CM | POA: Insufficient documentation

## 2017-10-28 MED ORDER — TRAMADOL HCL 50 MG PO TABS: 50.0000 mg | ORAL_TABLET | Freq: Four times a day (QID) | ORAL | 0 refills | Status: DC | PRN

## 2017-10-28 NOTE — ED Triage Notes (Signed)
Patient complains of right knee pain after wrestling with friends last night. Pain with ROM

## 2017-10-28 NOTE — ED Notes (Signed)
Declined W/C at D/C and was escorted to lobby by RN. 

## 2017-10-28 NOTE — ED Notes (Signed)
Patient currently in x ray.  Will be brought back to room upon completion

## 2017-10-28 NOTE — Discharge Instructions (Signed)
Please read attached information. If you experience any new or worsening signs or symptoms please return to the emergency room for evaluation. Please follow-up with your primary care provider or specialist as discussed. Please use medication prescribed only as directed and discontinue taking if you have any concerning signs or symptoms.   °
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
## 2017-11-10 ENCOUNTER — Emergency Department
Admission: EM | Admit: 2017-11-10 | Discharge: 2017-11-10 | Disposition: A | Discharge status: Home / Self Care | Payer: No Typology Code available for payment source | Attending: Emergency Medicine | Admitting: Emergency Medicine

## 2017-11-10 ENCOUNTER — Encounter: Payer: Self-pay | Admitting: Emergency Medicine

## 2017-11-10 ENCOUNTER — Emergency Department: Payer: No Typology Code available for payment source

## 2017-11-10 ENCOUNTER — Other Ambulatory Visit: Payer: Self-pay

## 2017-11-10 DIAGNOSIS — F17210 Nicotine dependence, cigarettes, uncomplicated: Secondary | ICD-10-CM | POA: Insufficient documentation

## 2017-11-10 DIAGNOSIS — M25552 Pain in left hip: Secondary | ICD-10-CM | POA: Insufficient documentation

## 2017-11-10 DIAGNOSIS — Z79899 Other long term (current) drug therapy: Secondary | ICD-10-CM | POA: Diagnosis not present

## 2017-11-10 DIAGNOSIS — J45909 Unspecified asthma, uncomplicated: Secondary | ICD-10-CM | POA: Diagnosis not present

## 2017-11-10 DIAGNOSIS — M25561 Pain in right knee: Secondary | ICD-10-CM | POA: Insufficient documentation

## 2017-11-10 MED ORDER — CYCLOBENZAPRINE HCL 5 MG PO TABS: 5.0000 mg | ORAL_TABLET | Freq: Three times a day (TID) | ORAL | 0 refills | Status: AC | PRN

## 2017-11-10 MED ORDER — MELOXICAM 15 MG PO TABS: 15.0000 mg | ORAL_TABLET | Freq: Every day | ORAL | 1 refills | Status: AC
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
## 2018-01-30 ENCOUNTER — Other Ambulatory Visit: Payer: Self-pay

## 2018-01-30 ENCOUNTER — Encounter (HOSPITAL_COMMUNITY): Payer: Self-pay

## 2018-01-30 ENCOUNTER — Emergency Department (HOSPITAL_COMMUNITY)
Admission: EM | Admit: 2018-01-30 | Discharge: 2018-01-31 | Disposition: A | Discharge status: Home / Self Care | Payer: Self-pay | Attending: Emergency Medicine | Admitting: Emergency Medicine

## 2018-01-30 DIAGNOSIS — H11433 Conjunctival hyperemia, bilateral: Secondary | ICD-10-CM | POA: Insufficient documentation

## 2018-01-30 DIAGNOSIS — Z5321 Procedure and treatment not carried out due to patient leaving prior to being seen by health care provider: Secondary | ICD-10-CM | POA: Insufficient documentation

## 2018-01-30 NOTE — ED Triage Notes (Signed)
Pt reports that his eyes have been red and itchy with drainage the last two days, reports daughter had pink eye last week, no redness noted

## 2018-01-31 NOTE — ED Notes (Signed)
Unable to locate pt in lobby for room  

## 2020-11-07 ENCOUNTER — Emergency Department (HOSPITAL_COMMUNITY)
Admission: EM | Admit: 2020-11-07 | Discharge: 2020-11-07 | Disposition: A | Discharge status: Home / Self Care | Payer: Self-pay | Attending: Emergency Medicine | Admitting: Emergency Medicine

## 2020-11-07 ENCOUNTER — Emergency Department (HOSPITAL_COMMUNITY): Payer: Self-pay

## 2020-11-07 ENCOUNTER — Other Ambulatory Visit: Payer: Self-pay

## 2020-11-07 ENCOUNTER — Encounter (HOSPITAL_COMMUNITY): Payer: Self-pay | Admitting: *Deleted

## 2020-11-07 DIAGNOSIS — R072 Precordial pain: Secondary | ICD-10-CM | POA: Insufficient documentation

## 2020-11-07 DIAGNOSIS — R739 Hyperglycemia, unspecified: Secondary | ICD-10-CM | POA: Insufficient documentation

## 2020-11-07 DIAGNOSIS — I10 Essential (primary) hypertension: Secondary | ICD-10-CM | POA: Insufficient documentation

## 2020-11-07 DIAGNOSIS — J45909 Unspecified asthma, uncomplicated: Secondary | ICD-10-CM | POA: Insufficient documentation

## 2020-11-07 DIAGNOSIS — F17210 Nicotine dependence, cigarettes, uncomplicated: Secondary | ICD-10-CM | POA: Insufficient documentation

## 2020-11-07 LAB — CBC WITH DIFFERENTIAL/PLATELET
Abs Immature Granulocytes: 0.02 10*3/uL (ref 0.00–0.07)
Basophils Absolute: 0 10*3/uL (ref 0.0–0.1)
Basophils Relative: 1 %
Eosinophils Absolute: 0.2 10*3/uL (ref 0.0–0.5)
Eosinophils Relative: 4 %
HCT: 47 % (ref 39.0–52.0)
Hemoglobin: 15.3 g/dL (ref 13.0–17.0)
Immature Granulocytes: 0 %
Lymphocytes Relative: 21 %
Lymphs Abs: 1.3 10*3/uL (ref 0.7–4.0)
MCH: 29.8 pg (ref 26.0–34.0)
MCHC: 32.6 g/dL (ref 30.0–36.0)
MCV: 91.6 fL (ref 80.0–100.0)
Monocytes Absolute: 0.5 10*3/uL (ref 0.1–1.0)
Monocytes Relative: 8 %
Neutro Abs: 3.8 10*3/uL (ref 1.7–7.7)
Neutrophils Relative %: 66 %
Platelets: 251 10*3/uL (ref 150–400)
RBC: 5.13 MIL/uL (ref 4.22–5.81)
RDW: 13.2 % (ref 11.5–15.5)
WBC: 5.8 10*3/uL (ref 4.0–10.5)
nRBC: 0 % (ref 0.0–0.2)

## 2020-11-07 LAB — D-DIMER, QUANTITATIVE: D-Dimer, Quant: <0.27 ug/mL-FEU (ref 0.00–0.50)

## 2020-11-07 LAB — BASIC METABOLIC PANEL
Anion gap: 8 (ref 5–15)
BUN: 14 mg/dL (ref 6–20)
CO2: 26 mmol/L (ref 22–32)
Calcium: 8.9 mg/dL (ref 8.9–10.3)
Chloride: 102 mmol/L (ref 98–111)
Creatinine, Ser: 1.08 mg/dL (ref 0.61–1.24)
GFR, Estimated: >60 mL/min (ref >60)
Glucose, Bld: 167 mg/dL — ABNORMAL HIGH (ref 70–99)
Potassium: 4 mmol/L (ref 3.5–5.1)
Sodium: 136 mmol/L (ref 135–145)

## 2020-11-07 LAB — TROPONIN I (HIGH SENSITIVITY)
Troponin I (High Sensitivity): 3 ng/L (ref <18)
Troponin I (High Sensitivity): 3 ng/L (ref <18)

## 2020-11-07 MED ORDER — SODIUM CHLORIDE 0.9 % IV BOLUS
1000.0000 mL | Freq: Once | INTRAVENOUS | Status: AC
  Administered 2020-11-07: 1000 mL via INTRAVENOUS

## 2020-11-07 MED ORDER — LIDOCAINE 5 % EX PTCH
1.0000 | MEDICATED_PATCH | CUTANEOUS | Status: DC
  Administered 2020-11-07: 1 via TRANSDERMAL
  Filled 2020-11-07: qty 1

## 2020-11-07 MED ORDER — IOHEXOL 350 MG/ML SOLN
100.0000 mL | Freq: Once | INTRAVENOUS | Status: AC | PRN
  Administered 2020-11-07: 100 mL via INTRAVENOUS

## 2020-11-07 MED ORDER — HYDRALAZINE HCL 25 MG PO TABS
25.0000 mg | ORAL_TABLET | Freq: Once | ORAL | Status: AC
  Administered 2020-11-07: 25 mg via ORAL
  Filled 2020-11-07: qty 1

## 2020-11-07 MED ORDER — HYDROCODONE-ACETAMINOPHEN 5-325 MG PO TABS
1.0000 | ORAL_TABLET | Freq: Once | ORAL | Status: AC
  Administered 2020-11-07: 1 via ORAL
  Filled 2020-11-07: qty 1

## 2020-11-07 MED ORDER — HYDROCHLOROTHIAZIDE 12.5 MG PO TABS: 12.5000 mg | ORAL_TABLET | Freq: Every day | ORAL | 0 refills | Status: DC

## 2020-11-07 NOTE — ED Notes (Signed)
Patient discharge instructions and prescriptions reviewed with the patient. The patient verbalized understanding of instructions. Patient discharged. ?

## 2020-11-07 NOTE — ED Provider Notes (Signed)
I assumed care of patient at shift change from previous team, please see their note for full H&P.  Briefly patient is here for evaluation of right-sided chest pain that began this morning when he awoke.    Physical Exam  BP (!) 204/154   Pulse 66   Temp (!) 97.5 F (36.4 C) (Oral)   Resp 10   SpO2 99%   Physical Exam Vitals and nursing note reviewed.  Constitutional:      General: He is not in acute distress. HENT:     Head: Normocephalic and atraumatic.  Cardiovascular:     Rate and Rhythm: Normal rate.  Pulmonary:     Effort: Pulmonary effort is normal. No respiratory distress.  Musculoskeletal:     Cervical back: No rigidity.  Neurological:     Mental Status: He is alert. Mental status is at baseline.     Comments: Awake and alert, answers all questions appropriately.  Speech is not slurred.    Psychiatric:        Mood and Affect: Mood normal.     ED Course/Procedures     Procedures  DG Chest 2 View  Result Date: 11/07/2020 CLINICAL DATA:  Sharp pain over the right ribs this morning. EXAM: CHEST - 2 VIEW COMPARISON:  Two-view chest x-ray 07/21/2015 FINDINGS: The heart size and mediastinal contours are within normal limits. Both lungs are clear. The visualized skeletal structures are unremarkable. IMPRESSION: Negative two view chest x-ray Electronically Signed   By: Marin Roberts M.D.   On: 11/07/2020 07:40   CT Angio Chest/Abd/Pel for Dissection W and/or W/WO  Result Date: 11/07/2020 CLINICAL DATA:  Chest pain and right lateral side pain for several hours EXAM: CT ANGIOGRAPHY CHEST, ABDOMEN AND PELVIS TECHNIQUE: Non-contrast CT of the chest was initially obtained. Multidetector CT imaging through the chest, abdomen and pelvis was performed using the standard protocol during bolus administration of intravenous contrast. Multiplanar reconstructed images and MIPs were obtained and reviewed to evaluate the vascular anatomy. CONTRAST:  OMNIPAQUE IOHEXOL 350 MG/ML  SOLN COMPARISON:  None. FINDINGS: CTA CHEST FINDINGS Cardiovascular: Preferential opacification of the thoracic aorta. No evidence of thoracic aortic aneurysm or dissection. Normal heart size. No pericardial effusion. No central pulmonary embolus. Mediastinum/Nodes: No enlarged mediastinal, hilar, or axillary lymph nodes. Thyroid gland, trachea, and esophagus demonstrate no significant findings. Lungs/Pleura: Bibasilar atelectasis. No focal consolidation. No pleural effusion or pneumothorax. Musculoskeletal: No chest wall abnormality. No acute or significant osseous findings. Review of the MIP images confirms the above findings. CTA ABDOMEN AND PELVIS FINDINGS VASCULAR Aorta: Normal caliber aorta without aneurysm, dissection, vasculitis or significant stenosis. Celiac: Patent without evidence of aneurysm, dissection, vasculitis or significant stenosis. SMA: Patent without evidence of aneurysm, dissection, vasculitis or significant stenosis. Choose 1 Renals: Both renal arteries are patent without evidence of aneurysm, dissection, vasculitis, fibromuscular dysplasia or significant stenosis. IMA: Patent without evidence of aneurysm, dissection, vasculitis or significant stenosis. Inflow: Patent without evidence of aneurysm, dissection, vasculitis or significant stenosis. Veins: No obvious venous abnormality within the limitations of this arterial phase study. Review of the MIP images confirms the above findings. NON-VASCULAR Hepatobiliary: No focal liver abnormality is seen. No gallstones, gallbladder wall thickening, or biliary dilatation. Pancreas: Unremarkable. No pancreatic ductal dilatation or surrounding inflammatory changes. Spleen: Normal in size without focal abnormality. Adrenals/Urinary Tract: Adrenal glands are unremarkable. Kidneys are normal, without renal calculi, focal lesion, or hydronephrosis. Bladder is unremarkable. Stomach/Bowel: Stomach is within normal limits. Appendix appears normal. No evidence  of  bowel wall thickening, distention, or inflammatory changes. Lymphatic: No adenopathy. Reproductive: Prostate is unremarkable. Other: No abdominal wall hernia or abnormality. No abdominopelvic ascites. Musculoskeletal: No acute or significant osseous findings. Left iliac bone island. Review of the MIP images confirms the above findings. IMPRESSION: 1. No evidence of aortic aneurysm or dissection. 2. No acute intrathoracic or intra-abdominal pathology. Electronically Signed   By: Maudry Mayhew MD   On: 11/07/2020 15:38    Labs Reviewed  BASIC METABOLIC PANEL - Abnormal; Notable for the following components:      Result Value   Glucose, Bld 167 (*)    All other components within normal limits  CBC WITH DIFFERENTIAL/PLATELET  D-DIMER, QUANTITATIVE (NOT AT [facility])  TROPONIN I (HIGH SENSITIVITY)  TROPONIN I (HIGH SENSITIVITY)     MDM   Plan to follow up on BP, dissection study, trop.  If all normal d/c with BP meds.   Patient is reassessed, he is lying comfortably in bed in no obvious distress.  We discussed his results.  Will start on low-dose HCTZ.  We discussed the importance of outpatient follow-up.    Patient gave me a form from the plasma center asking for medical release.  I declined to complete this stating that it needs to be filled out by a primary doctor.   Return precautions were discussed with patient who states their understanding.  At the time of discharge patient denied any unaddressed complaints or concerns.  Patient is agreeable for discharge home.  Note: Portions of this report may have been transcribed using voice recognition software. Every effort was made to ensure accuracy; however, inadvertent computerized transcription errors may be present         Norman Clay 11/07/20 1721    Charlynne Pander, MD 11/07/20 2129

## 2020-11-07 NOTE — ED Triage Notes (Signed)
Pt states started having pain with deep breath 3 hours ago to his right lateral rib area.  Denies fever or cough.

## 2020-11-07 NOTE — ED Provider Notes (Signed)
[facility] EMERGENCY DEPARTMENT Provider Note   CSN: [id number] Arrival date & time: 11/07/20  9163    History Chief Complaint  Patient presents with  . Chest Pain    Right rib pain- sharp pain with deep breath    Steven Hubbard is a 45 y.o. male with past medical history significant for right sided chest pain. Pain began this morning when he awoke. No exertional CP, Does not radiate. Worse when he takes a deep breath. No recent injury or trauma. Feels pain when he palpated his right lateral chest wall. No fever, chills, nausea, vomiting, hemoptysis, abdominal pain, unilateral leg swelling, redness, warmth, history of PE, DVT, recent surgery, immobilization or malignancy.  No family or personal history of clotting disorders.  States pain initially was a 9/10 however his pain significantly improved while in the waiting room. Currently is a 4/10 only with deep breathing. No pain at with normal work of breathing.  Pain is also worse when he twists and bends at his rib cage.  No rashes or lesions.  Chest pain does not radiate into his back.  No substernal chest pain.  Has never had anything like this previously.  He is able to perform his ADLs without any pain, shortness of breath.  He does use tobacco however has no history of hypertension, hyperlipidemia, family history of MI at early age.  Denies additional aggravating or alleviating factors.  No known Covid exposures. No associated diaphoresis, N,V, radiation into jaw or arm. States was told at a visit when he went to donate plasma previously that his BP was elevated and they referred him to PCP for evaluation however he did not and does not currently follow up PCP outpatient.  History obtained from patient and past medical records.  No interpreter used.  HPI  HPI: A 45 year old patient presents for evaluation of chest pain. Initial onset of pain was approximately 3-6 hours ago. The patient's chest pain is well-localized, is  sharp and is not worse with exertion. The patient's chest pain is not middle- or left-sided, is not described as heaviness/pressure/tightness and does not radiate to the arms/jaw/neck. The patient does not complain of nausea and denies diaphoresis. The patient has smoked in the past 90 days. The patient has no history of stroke, has no history of peripheral artery disease, denies any history of treated diabetes, has no relevant family history of coronary artery disease (first degree relative at less than age 69), is not hypertensive, has no history of hypercholesterolemia and does not have an elevated BMI (>=30).   Past Medical History:  Diagnosis Date  . Asthma     There are no problems to display for this patient.   History reviewed. No pertinent surgical history.     No family history on file.  Social History   Tobacco Use  . Smoking status: Current Every Day Smoker    Packs/day: 0.15    Years: 1.00    Pack years: 0.15    Types: Cigarettes  . Smokeless tobacco: Never Used  Vaping Use  . Vaping Use: Never used  Substance Use Topics  . Alcohol use: Yes    Comment: occasioanly  . Drug use: Yes    Types: Marijuana    Comment: occasionally    Home Medications Prior to Admission medications   Not on File    Allergies    Patient has no known allergies.  Review of Systems   Review of Systems  Constitutional: Negative.  HENT: Negative.   Respiratory: Negative.   Cardiovascular: Positive for chest pain (With deep breathing).  Gastrointestinal: Negative.   Genitourinary: Negative.   Musculoskeletal: Negative.   Skin: Negative.   Neurological: Negative.   All other systems reviewed and are negative.   Physical Exam Updated Vital Signs BP (!) 204/154   Pulse 66   Temp (!) 97.5 F (36.4 C) (Oral)   Resp 10   SpO2 99%   Physical Exam Vitals and nursing note reviewed.  Constitutional:      General: He is not in acute distress.    Appearance: He is not  ill-appearing, toxic-appearing or diaphoretic.  HENT:     Head: Normocephalic and atraumatic.     Jaw: There is normal jaw occlusion.     Nose: Nose normal.  Eyes:     Extraocular Movements: Extraocular movements intact.  Neck:     Vascular: No carotid bruit or JVD.     Trachea: Trachea and phonation normal.     Meningeal: Brudzinski's sign and Kernig's sign absent.  Cardiovascular:     Rate and Rhythm: Normal rate.     Pulses: Normal pulses.          Radial pulses are 2+ on the right side and 2+ on the left side.       Posterior tibial pulses are 2+ on the right side and 2+ on the left side.     Heart sounds: Normal heart sounds.  Pulmonary:     Effort: Pulmonary effort is normal.     Breath sounds: Normal breath sounds and air entry.     Comments: Speaks in full sentences without difficulty Chest:     Chest wall: No deformity, swelling, crepitus or edema.       Comments: Able to reproduce pain on exam. Abdominal:     General: Bowel sounds are normal.     Palpations: Abdomen is soft.     Tenderness: There is no abdominal tenderness. There is no right CVA tenderness, left CVA tenderness, guarding or rebound. Negative signs include Murphy's sign and McBurney's sign.     Hernia: No hernia is present.     Comments: Soft non tender without rebound or guarding. Negative Murphy sign.  Musculoskeletal:     Cervical back: Full passive range of motion without pain, normal range of motion and neck supple.     Right lower leg: No edema.     Left lower leg: No edema.     Comments: Moves all 4 extremities without difficulty. Homans sign negative. Non bony tenderness  Feet:     Right foot:     Skin integrity: Skin integrity normal.     Left foot:     Skin integrity: Skin integrity normal.  Skin:    General: Skin is warm.     Capillary Refill: Capillary refill takes less than 2 seconds.     Comments: No rashes or lesions.  Neurological:     General: No focal deficit present.      Mental Status: He is alert and oriented to person, place, and time.     Cranial Nerves: Cranial nerves are intact.     Motor: Motor function is intact.     Gait: Gait is intact.    ED Results / Procedures / Treatments   Labs (all labs ordered are listed, but only abnormal results are displayed) Labs Reviewed  BASIC METABOLIC PANEL - Abnormal; Notable for the following components:      Result  Value   Glucose, Bld 167 (*)    All other components within normal limits  CBC WITH DIFFERENTIAL/PLATELET  D-DIMER, QUANTITATIVE (NOT AT [facility])  TROPONIN I (HIGH SENSITIVITY)  TROPONIN I (HIGH SENSITIVITY)    EKG EKG Interpretation  Date/Time:  Sunday November 07 2020 06:53:28 EST Ventricular Rate:  87 PR Interval:  148 QRS Duration: 80 QT Interval:  358 QTC Calculation: 430 R Axis:   64 Text Interpretation: Normal sinus rhythm Septal infarct , age undetermined Abnormal ECG no acute STEMI Confirmed by Marianna Fuss ([id number]) on 11/07/2020 2:49:53 PM   Radiology DG Chest 2 View  Result Date: 11/07/2020 CLINICAL DATA:  Sharp pain over the right ribs this morning. EXAM: CHEST - 2 VIEW COMPARISON:  Two-view chest x-ray 07/21/2015 FINDINGS: The heart size and mediastinal contours are within normal limits. Both lungs are clear. The visualized skeletal structures are unremarkable. IMPRESSION: Negative two view chest x-ray Electronically Signed   By: Marin Roberts M.D.   On: 11/07/2020 07:40    Procedures Procedures   Medications Ordered in ED Medications  hydrALAZINE (APRESOLINE) tablet 25 mg (has no administration in time range)  sodium chloride 0.9 % bolus 1,000 mL (0 mLs Intravenous Stopped 11/07/20 1438)  HYDROcodone-acetaminophen (NORCO/VICODIN) 5-325 MG per tablet 1 tablet (1 tablet Oral Given 11/07/20 1302)   ED Course  I have reviewed the triage vital signs and the nursing notes.  Pertinent labs & imaging results that were available during my care of the patient were reviewed  by me and considered in my medical decision making (see chart for details).  Unfortunately patient with wait in the WR of greater than 6 hours prior to being roomed in back for provider assessment.  Patient with right sided pleuritic CP upon awakening this morning.  He is afebrile, nonseptic, not ill-appearing.  No recent injury or trauma.  No cough, shortness of breath, diaphoresis.  No clinical evidence of DVT on exam.  He is PERC negative, Wells criteria low risk.  Heart score 1.  Symptoms do not seem consistent with dissection, pneumothorax, ACS.  No rash to suggest shingles. Abd soft non tender. Negative murphy sign. No dysuria, hematuria. Low suspicion for atypical abd process as cause of his pain. Will plan on labs, imaging and reassess.  Labs and imaging personally reviewed and interpreted:  CBC without leukocytosis Metabolic panel with mild hyperglycemia to 167 however no additional electrolyte, renal or normality D-dimer <0.27 Trop 3 DG chest without acute abnormality EKG without ischemic changes  Patient reassessed. Now states having pain in right back. Noted to be significantly hypertensive in room with BP 204/154. No diaphoresis. Will do CTA dissection study and add Hydralazine for BP management. Discussed with attending Dr. Stevie Kern who is in agreement with plan.  Patient care transferred to [facility], PA-C at shift change. Plan if CTA negative, repeat trop WNL and BP improved to dc home with PCP follow up.    MDM Rules/Calculators/A&P HEAR Score: 1                         Final Clinical Impression(s) / ED Diagnoses Final diagnoses:  Precordial pain  Hypertension, unspecified type    Rx / DC Orders ED Discharge Orders    None       Kenzli Barritt A, PA-C 11/07/20 1512    Milagros Loll, MD 11/08/20 [phone]

## 2020-11-07 NOTE — Discharge Instructions (Addendum)
May take Tylenol or Ibuprofen for pain.  If the lidocaine/numbing patch helps you may leave it in place for 12 hours.  After that you need to take it off and wait 12 hours before putting a new one on.  These are available over-the-counter.  Return for new or worsening symptoms.   Today your CT scan and lab work-up was very reassuring.  Your blood pressure is elevated. I have given you a low-dose of a blood pressure medicine.  This can make you urinate frequently. If you do not have a primary care doctor it is very important that you get one and get your blood pressure rechecked in the next month.  If your blood pressure is still high then you will need to continue high blood pressure medicine long-term.  Even though you cannot feel high blood pressure, when untreated it causes damage to your brain, heart, kidneys, lungs, and other organs.

## 2020-11-08 ENCOUNTER — Emergency Department (HOSPITAL_COMMUNITY)
Admission: EM | Admit: 2020-11-08 | Discharge: 2020-11-09 | Disposition: A | Discharge status: Home / Self Care | Payer: Self-pay | Attending: Emergency Medicine | Admitting: Emergency Medicine

## 2020-11-08 ENCOUNTER — Encounter (HOSPITAL_COMMUNITY): Payer: Self-pay | Admitting: *Deleted

## 2020-11-08 ENCOUNTER — Other Ambulatory Visit: Payer: Self-pay

## 2020-11-08 DIAGNOSIS — R079 Chest pain, unspecified: Secondary | ICD-10-CM | POA: Insufficient documentation

## 2020-11-08 DIAGNOSIS — F17210 Nicotine dependence, cigarettes, uncomplicated: Secondary | ICD-10-CM | POA: Insufficient documentation

## 2020-11-08 DIAGNOSIS — J45909 Unspecified asthma, uncomplicated: Secondary | ICD-10-CM | POA: Insufficient documentation

## 2020-11-08 LAB — CBC
HCT: 45.2 % (ref 39.0–52.0)
Hemoglobin: 15.5 g/dL (ref 13.0–17.0)
MCH: 30.7 pg (ref 26.0–34.0)
MCHC: 34.3 g/dL (ref 30.0–36.0)
MCV: 89.5 fL (ref 80.0–100.0)
Platelets: 276 10*3/uL (ref 150–400)
RBC: 5.05 MIL/uL (ref 4.22–5.81)
RDW: 13.2 % (ref 11.5–15.5)
WBC: 5.7 10*3/uL (ref 4.0–10.5)
nRBC: 0 % (ref 0.0–0.2)

## 2020-11-08 LAB — BASIC METABOLIC PANEL
Anion gap: 12 (ref 5–15)
BUN: 11 mg/dL (ref 6–20)
CO2: 27 mmol/L (ref 22–32)
Calcium: 9.3 mg/dL (ref 8.9–10.3)
Chloride: 99 mmol/L (ref 98–111)
Creatinine, Ser: 1.13 mg/dL (ref 0.61–1.24)
GFR, Estimated: >60 mL/min (ref >60)
Glucose, Bld: 121 mg/dL — ABNORMAL HIGH (ref 70–99)
Potassium: 3.5 mmol/L (ref 3.5–5.1)
Sodium: 138 mmol/L (ref 135–145)

## 2020-11-08 LAB — TROPONIN I (HIGH SENSITIVITY): Troponin I (High Sensitivity): 6 ng/L (ref <18)

## 2020-11-08 NOTE — ED Triage Notes (Signed)
Pt reports sharp pain in the right rib, hurts to breath in. Pain in the center of the chest, feels tender and sore. Denies injury. Denies fever and cough. Says that he feels like his blood pressure is elevated, did not get rx filled for BP meds. Seen for the same last night, said the pain has returned.

## 2020-11-09 LAB — TROPONIN I (HIGH SENSITIVITY): Troponin I (High Sensitivity): 7 ng/L (ref <18)

## 2020-11-09 NOTE — ED Notes (Signed)
Pt was called by phlebotomist in lobby x2 without response.

## 2020-11-09 NOTE — ED Provider Notes (Signed)
[facility] EMERGENCY DEPARTMENT Provider Note   CSN: [id number] Arrival date & time: 11/08/20  2105     History Chief Complaint  Patient presents with  . Chest Pain    Steven Hubbard is a 45 y.o. male who presents for evaluation of right-sided chest pain.  Patient reports that he was seen here on 11/07/2020 for evaluation of the same pain.  He reports his work-up was unremarkable and states that he was discharged home.  He states that since discharge, the pain never went away.  It has consistently been in the right side and has not radiated.  He states that the pain was not worse with exertion.  He does state it was slightly worse when he took a deep breath or moved.  No previous trauma injury, fall.  He states he has not had any fever, cough, chills.  Patient reports that his significant other was being evaluated in the ED and since he was not getting any better, he decided to get evaluated.  He reports that while waiting in the waiting room, the chest pain resolved and he currently is pain-free.  He denies any shortness of breath.  He states he does smoke and states that a pack of cigarettes will last him about 2 days.  He denies any cocaine, heroin, marijuana use.  He denies any personal cardiac history or family history of MI before the age of 31.  He states he does not have any history of diabetes.  He does report that he has had some high blood pressure readings in the past but has not had a documented diagnosis of hypertension.  He states he tried to get plasma about a month ago and was noted to have high blood pressure at that time.  When he was seen here on 11/07/2020, he was given a prescription for HCTZ which he has not picked up.  He denies any nausea/vomiting, diaphoresis. He denies any exogenous hormone use, recent immobilization, prior history of DVT/PE, recent surgery, leg swelling, or long travel.  The history is provided by the patient.    HPI: A 45 year old patient  presents for evaluation of chest pain. Initial onset of pain was more than 6 hours ago. The patient's chest pain is not worse with exertion. The patient's chest pain is not middle- or left-sided, is not well-localized, is not described as heaviness/pressure/tightness, is not sharp and does not radiate to the arms/jaw/neck. The patient does not complain of nausea and denies diaphoresis. The patient has smoked in the past 90 days. The patient has no history of stroke, has no history of peripheral artery disease, denies any history of treated diabetes, has no relevant family history of coronary artery disease (first degree relative at less than age 57), is not hypertensive, has no history of hypercholesterolemia and does not have an elevated BMI (>=30).   Past Medical History:  Diagnosis Date  . Asthma     There are no problems to display for this patient.   History reviewed. No pertinent surgical history.     No family history on file.  Social History   Tobacco Use  . Smoking status: Current Every Day Smoker    Packs/day: 0.15    Years: 1.00    Pack years: 0.15    Types: Cigarettes  . Smokeless tobacco: Never Used  Vaping Use  . Vaping Use: Never used  Substance Use Topics  . Alcohol use: Yes    Comment: occasioanly  .  Drug use: Yes    Types: Marijuana    Comment: occasionally    Home Medications Prior to Admission medications   Medication Sig Start Date End Date Taking? Authorizing Provider  hydrochlorothiazide (HYDRODIURIL) 12.5 MG tablet Take 1 tablet (12.5 mg total) by mouth daily. 11/07/20   Cristina Gong, PA-C    Allergies    Patient has no known allergies.  Review of Systems   Review of Systems  Constitutional: Negative for fever.  Respiratory: Negative for cough and shortness of breath.   Cardiovascular: Positive for chest pain (resolved).  Gastrointestinal: Negative for abdominal pain, nausea and vomiting.  Genitourinary: Negative for dysuria and  hematuria.  Neurological: Negative for headaches.  All other systems reviewed and are negative.   Physical Exam Updated Vital Signs BP (!) 149/110   Pulse 77   Temp (!) 97.5 F (36.4 C) (Oral)   Resp 11   SpO2 99%   Physical Exam Vitals and nursing note reviewed.  Constitutional:      Appearance: Normal appearance. He is well-developed and well-nourished.     Comments: Resting comfortably, NAD  HENT:     Head: Normocephalic and atraumatic.     Mouth/Throat:     Mouth: Oropharynx is clear and moist and mucous membranes are normal.  Eyes:     General: Lids are normal.     Extraocular Movements: EOM normal.     Conjunctiva/sclera: Conjunctivae normal.     Pupils: Pupils are equal, round, and reactive to light.  Cardiovascular:     Rate and Rhythm: Normal rate and regular rhythm.     Pulses: Normal pulses.     Heart sounds: Normal heart sounds. No murmur heard. No friction rub. No gallop.   Pulmonary:     Effort: Pulmonary effort is normal.     Breath sounds: Normal breath sounds.     Comments: Lungs clear to auscultation bilaterally.  Symmetric chest rise.  No wheezing, rales, rhonchi. Abdominal:     Palpations: Abdomen is soft. Abdomen is not rigid.     Tenderness: There is no abdominal tenderness. There is no guarding.     Comments: Abdomen is soft, non-distended, non-tender. No rigidity, No guarding. No peritoneal signs.  Musculoskeletal:        General: Normal range of motion.     Cervical back: Full passive range of motion without pain.  Skin:    General: Skin is warm and dry.     Capillary Refill: Capillary refill takes less than 2 seconds.  Neurological:     Mental Status: He is alert and oriented to person, place, and time.  Psychiatric:        Mood and Affect: Mood and affect normal.        Speech: Speech normal.     ED Results / Procedures / Treatments   Labs (all labs ordered are listed, but only abnormal results are displayed) Labs Reviewed  BASIC  METABOLIC PANEL - Abnormal; Notable for the following components:      Result Value   Glucose, Bld 121 (*)    All other components within normal limits  CBC  TROPONIN I (HIGH SENSITIVITY)  TROPONIN I (HIGH SENSITIVITY)    EKG EKG Interpretation  Date/Time:  Monday November 08 2020 21:13:16 EST Ventricular Rate:  115 PR Interval:  144 QRS Duration: 84 QT Interval:  324 QTC Calculation: 448 R Axis:   24 Text Interpretation: Sinus tachycardia Anterior infarct , age undetermined Abnormal ECG rate faster, otherwise  similar to previous Confirmed by Frederick Peers [phone]) on 11/09/2020 10:30:25 AM   Radiology CT Angio Chest/Abd/Pel for Dissection W and/or W/WO  Result Date: 11/07/2020 CLINICAL DATA:  Chest pain and right lateral side pain for several hours EXAM: CT ANGIOGRAPHY CHEST, ABDOMEN AND PELVIS TECHNIQUE: Non-contrast CT of the chest was initially obtained. Multidetector CT imaging through the chest, abdomen and pelvis was performed using the standard protocol during bolus administration of intravenous contrast. Multiplanar reconstructed images and MIPs were obtained and reviewed to evaluate the vascular anatomy. CONTRAST:  OMNIPAQUE IOHEXOL 350 MG/ML SOLN COMPARISON:  None. FINDINGS: CTA CHEST FINDINGS Cardiovascular: Preferential opacification of the thoracic aorta. No evidence of thoracic aortic aneurysm or dissection. Normal heart size. No pericardial effusion. No central pulmonary embolus. Mediastinum/Nodes: No enlarged mediastinal, hilar, or axillary lymph nodes. Thyroid gland, trachea, and esophagus demonstrate no significant findings. Lungs/Pleura: Bibasilar atelectasis. No focal consolidation. No pleural effusion or pneumothorax. Musculoskeletal: No chest wall abnormality. No acute or significant osseous findings. Review of the MIP images confirms the above findings. CTA ABDOMEN AND PELVIS FINDINGS VASCULAR Aorta: Normal caliber aorta without aneurysm, dissection, vasculitis or  significant stenosis. Celiac: Patent without evidence of aneurysm, dissection, vasculitis or significant stenosis. SMA: Patent without evidence of aneurysm, dissection, vasculitis or significant stenosis. Choose 1 Renals: Both renal arteries are patent without evidence of aneurysm, dissection, vasculitis, fibromuscular dysplasia or significant stenosis. IMA: Patent without evidence of aneurysm, dissection, vasculitis or significant stenosis. Inflow: Patent without evidence of aneurysm, dissection, vasculitis or significant stenosis. Veins: No obvious venous abnormality within the limitations of this arterial phase study. Review of the MIP images confirms the above findings. NON-VASCULAR Hepatobiliary: No focal liver abnormality is seen. No gallstones, gallbladder wall thickening, or biliary dilatation. Pancreas: Unremarkable. No pancreatic ductal dilatation or surrounding inflammatory changes. Spleen: Normal in size without focal abnormality. Adrenals/Urinary Tract: Adrenal glands are unremarkable. Kidneys are normal, without renal calculi, focal lesion, or hydronephrosis. Bladder is unremarkable. Stomach/Bowel: Stomach is within normal limits. Appendix appears normal. No evidence of bowel wall thickening, distention, or inflammatory changes. Lymphatic: No adenopathy. Reproductive: Prostate is unremarkable. Other: No abdominal wall hernia or abnormality. No abdominopelvic ascites. Musculoskeletal: No acute or significant osseous findings. Left iliac bone island. Review of the MIP images confirms the above findings. IMPRESSION: 1. No evidence of aortic aneurysm or dissection. 2. No acute intrathoracic or intra-abdominal pathology. Electronically Signed   By: Maudry Mayhew MD   On: 11/07/2020 15:38    Procedures Procedures   Medications Ordered in ED Medications - No data to display  ED Course  I have reviewed the triage vital signs and the nursing notes.  Pertinent labs & imaging results that were  available during my care of the patient were reviewed by me and considered in my medical decision making (see chart for details).    MDM Rules/Calculators/A&P HEAR Score: 1                        45 year old male who presents for evaluation of right-sided chest pain.  Was seen on 11/07/2020 for evaluation of the same symptoms.  He reports that it had not gotten any better.  He did not pick up the medications that he was prescribed.  He states his significant other was being evaluated for something in the ED and since he was not getting better, he checked in.  He states that while in the waiting room, the chest pain resolved and he is currently pain-free.  Denies any difficulty breathing, nausea/vomiting, diaphoresis.  He tells me that the pain was the exact same as he was experiencing on 11/07/2020 and never had any resolution of the pain after he was discharged on 11/07/2020.  On initial arrival, he is afebrile, nontoxic-appearing.  His initial heart rate was within normal limits.  When he moved back to the ED, he did have some mild tachycardia at 106.  This normalized and his heart rate is now in the 90s.  Additionally, he is slightly hypertensive with systolic blood pressure in the 140s.  He has had documented history of blood pressures being elevated before.  He shows me a slip from the plasma donation center that showed his blood pressure to be elevated.  He has never been formally diagnosed with high blood pressure.  He does not have a primary care doctor.  At this time, patient is well-appearing, no signs of distress.  Doubt ACS given atypical features.  Additionally, history/physical exam is not concerning for dissection.  History/physical exam is not concerning for PE.  I reviewed his work-up from 11/07/2020.  He had troponins that were negative x2 as well as a negative D-dimer.  He has had a CTA that showed no evidence of aortic aneurysm or dissection.  BMP shows normal BUN/creatinine.  CBC without any  significant leukocytosis.  Troponin x2 is negative.  Patient has a heart score of 1.  Given his atypical features as well as 2 - troponins and the fact that his pain has been consistently ongoing for the last 2 days, do not feel this represents acute cardiac event.  I discussed results with patient.  I discussed with patient regarding obtaining a chest x-ray.  Patient states that there is one that was just done 2 days ago and he does not feel the need to repeat it.  I discussed that we could repeat it to compare from his previous 1 to see if there is any interval changes.  After discussion, patient opted to decline chest x-ray.  Patient states he does not have any symptoms at this time and is ready to go home.  At this time, patient is hemodynamically stable with no signs of distress.  I did discuss with him regarding picking up the HCTZ and starting it.  Additionally, encouraged him to find a primary care doctor for reevaluation and recheck of his blood pressure.  At this time, patient is well-appearing. At this time, patient exhibits no emergent life-threatening condition that require further evaluation in ED. Patient had ample opportunity for questions and discussion. All patient's questions were answered with full understanding. Strict return precautions discussed. Patient expresses understanding and agreement to plan.   Portions of this note were generated with Scientist, clinical (histocompatibility and immunogenetics). Dictation errors may occur despite best attempts at proofreading.  Final Clinical Impression(s) / ED Diagnoses Final diagnoses:  Nonspecific chest pain    Rx / DC Orders ED Discharge Orders    None       Rosana Hoes 11/09/20 1156    Little, Ambrose Finland, MD 11/10/20 1528

## 2020-11-09 NOTE — Discharge Instructions (Addendum)
As we discussed, your previously prescribed medication for your blood pressure.  It is important that you pick that up and begin it.  Additionally, it is important that you establish with a primary care doctor for follow-up.  Your blood pressure was slightly elevated here.  This needs to be rechecked.  Return to emergency department for any worsening chest pain, difficulty breathing, vomiting or any other worsening concerning symptoms.

## 2020-11-10 ENCOUNTER — Encounter: Payer: Self-pay | Admitting: *Deleted

## 2020-11-10 NOTE — Congregational Nurse Program (Signed)
  Dept: [phone]   Congregational Nurse Program Note  Date of Encounter: 11/10/2020  Past Medical History: Past Medical History:  Diagnosis Date  . Asthma     Encounter Details:  CNP Questionnaire - 11/10/20 1533      Questionnaire   Do you give verbal consent to treat you today? Yes    Visit Setting Church or Organization    Location Patient Served At [facility]    Patient Status Homeless    Medical Provider No    Insurance Uninsured (Includes [employer] [place])    Intervention Refer;Support    Housing/Utilities No permanent housing    Transportation Need transportation assistance   provided by [employer]   Medication Provided medication assistance [employer], drug rep, etc.)    Referrals Other   [facility]         Client referred by [employer]. Client is needing help paying for his blood pressure medication. Completed form for medication voucher and referred to [facility]. Client was given bus passes by [employer] to pick up medication. Yisel Megill W RN CN [phone]

## 2020-11-19 ENCOUNTER — Encounter (HOSPITAL_COMMUNITY): Payer: Self-pay

## 2020-11-19 ENCOUNTER — Other Ambulatory Visit: Payer: Self-pay

## 2020-11-19 ENCOUNTER — Emergency Department (HOSPITAL_COMMUNITY)
Admission: EM | Admit: 2020-11-19 | Discharge: 2020-11-20 | Disposition: A | Discharge status: Home / Self Care | Payer: Self-pay | Attending: Emergency Medicine | Admitting: Emergency Medicine

## 2020-11-19 DIAGNOSIS — F17210 Nicotine dependence, cigarettes, uncomplicated: Secondary | ICD-10-CM | POA: Insufficient documentation

## 2020-11-19 DIAGNOSIS — J45909 Unspecified asthma, uncomplicated: Secondary | ICD-10-CM | POA: Insufficient documentation

## 2020-11-19 DIAGNOSIS — R609 Edema, unspecified: Secondary | ICD-10-CM | POA: Insufficient documentation

## 2020-11-19 NOTE — ED Triage Notes (Signed)
Patient reports he was at [facility] and noticed that his legs and ankles were more swollen, states he has been on his feet more the last couple of weeks.

## 2020-11-20 ENCOUNTER — Emergency Department (HOSPITAL_COMMUNITY): Payer: Self-pay

## 2020-11-20 LAB — URINALYSIS, ROUTINE W REFLEX MICROSCOPIC
Bilirubin Urine: NEGATIVE
Glucose, UA: NEGATIVE mg/dL
Hgb urine dipstick: NEGATIVE
Ketones, ur: NEGATIVE mg/dL
Leukocytes,Ua: NEGATIVE
Nitrite: NEGATIVE
Protein, ur: NEGATIVE mg/dL
Specific Gravity, Urine: 1.027 (ref 1.005–1.030)
pH: 5 (ref 5.0–8.0)

## 2020-11-20 LAB — BRAIN NATRIURETIC PEPTIDE: B Natriuretic Peptide: 34.7 pg/mL (ref 0.0–100.0)

## 2020-11-20 LAB — CBC WITH DIFFERENTIAL/PLATELET
Abs Immature Granulocytes: 0.02 10*3/uL (ref 0.00–0.07)
Basophils Absolute: 0 10*3/uL (ref 0.0–0.1)
Basophils Relative: 1 %
Eosinophils Absolute: 0.4 10*3/uL (ref 0.0–0.5)
Eosinophils Relative: 7 %
HCT: 41.2 % (ref 39.0–52.0)
Hemoglobin: 13.8 g/dL (ref 13.0–17.0)
Immature Granulocytes: 0 %
Lymphocytes Relative: 25 %
Lymphs Abs: 1.5 10*3/uL (ref 0.7–4.0)
MCH: 30.9 pg (ref 26.0–34.0)
MCHC: 33.5 g/dL (ref 30.0–36.0)
MCV: 92.2 fL (ref 80.0–100.0)
Monocytes Absolute: 0.7 10*3/uL (ref 0.1–1.0)
Monocytes Relative: 11 %
Neutro Abs: 3.4 10*3/uL (ref 1.7–7.7)
Neutrophils Relative %: 56 %
Platelets: 248 10*3/uL (ref 150–400)
RBC: 4.47 MIL/uL (ref 4.22–5.81)
RDW: 13.3 % (ref 11.5–15.5)
WBC: 6.1 10*3/uL (ref 4.0–10.5)
nRBC: 0 % (ref 0.0–0.2)

## 2020-11-20 LAB — COMPREHENSIVE METABOLIC PANEL
ALT: 21 U/L (ref 0–44)
AST: 26 U/L (ref 15–41)
Albumin: 3.4 g/dL — ABNORMAL LOW (ref 3.5–5.0)
Alkaline Phosphatase: 66 U/L (ref 38–126)
Anion gap: 5 (ref 5–15)
BUN: 14 mg/dL (ref 6–20)
CO2: 21 mmol/L — ABNORMAL LOW (ref 22–32)
Calcium: 7.5 mg/dL — ABNORMAL LOW (ref 8.9–10.3)
Chloride: 115 mmol/L — ABNORMAL HIGH (ref 98–111)
Creatinine, Ser: 0.97 mg/dL (ref 0.61–1.24)
GFR, Estimated: >60 mL/min (ref >60)
Glucose, Bld: 86 mg/dL (ref 70–99)
Potassium: 3.8 mmol/L (ref 3.5–5.1)
Sodium: 141 mmol/L (ref 135–145)
Total Bilirubin: 0.2 mg/dL — ABNORMAL LOW (ref 0.3–1.2)
Total Protein: 6.2 g/dL — ABNORMAL LOW (ref 6.5–8.1)

## 2020-11-20 MED ORDER — FUROSEMIDE 40 MG PO TABS: 40.0000 mg | ORAL_TABLET | Freq: Every day | ORAL | 0 refills | Status: DC

## 2020-11-20 NOTE — ED Notes (Signed)
Pt called 3x no answer  

## 2020-11-20 NOTE — ED Notes (Signed)
Patient transported to X-ray 

## 2020-11-20 NOTE — Discharge Instructions (Signed)
Stay on a low salt diet. °

## 2020-11-20 NOTE — ED Provider Notes (Signed)
[facility] EMERGENCY DEPARTMENT Provider Note   CSN: [id number] Arrival date & time: 11/19/20  2210   History Chief Complaint  Patient presents with  . Leg Swelling    Steven Hubbard is a 45 y.o. male.  The history is provided by the patient.  He has history of asthma and comes in complaining of leg swelling for the last 3 days.  He is also noted some puffiness in his hands.  He denies any dyspnea and denies any chest pain.  He has not had similar swelling in the past.  Symptoms seem to be getting worse.  He has not done anything to treat it.  Past Medical History:  Diagnosis Date  . Asthma     There are no problems to display for this patient.   History reviewed. No pertinent surgical history.     History reviewed. No pertinent family history.  Social History   Tobacco Use  . Smoking status: Current Every Day Smoker    Packs/day: 0.15    Years: 1.00    Pack years: 0.15    Types: Cigarettes  . Smokeless tobacco: Never Used  Vaping Use  . Vaping Use: Never used  Substance Use Topics  . Alcohol use: Yes    Comment: occasioanly  . Drug use: Yes    Types: Marijuana    Comment: occasionally    Home Medications Prior to Admission medications   Medication Sig Start Date End Date Taking? Authorizing Provider  hydrochlorothiazide (HYDRODIURIL) 12.5 MG tablet Take 1 tablet (12.5 mg total) by mouth daily. 11/07/20   Cristina Gong, PA-C    Allergies    Patient has no known allergies.  Review of Systems   Review of Systems  All other systems reviewed and are negative.   Physical Exam Updated Vital Signs BP (!) 142/91 (BP Location: Left Arm)   Pulse 79   Temp 97.8 F (36.6 C)   Resp 17   Ht 5\' 8"  (1.727 m)   Wt 86.2 kg   SpO2 97%   BMI 28.89 kg/m   Physical Exam Vitals and nursing note reviewed.   45 year old male, resting comfortably and in no acute distress. Vital signs are significant for borderline elevated blood pressure.  Oxygen saturation is 97%, which is normal. Head is normocephalic and atraumatic. PERRLA, EOMI. Oropharynx is clear. Neck is nontender and supple without adenopathy or JVD. Back is nontender and there is no CVA tenderness.  There is 1+ presacral edema. Lungs are clear without rales, wheezes, or rhonchi. Chest is nontender. Heart has regular rate and rhythm without murmur. Abdomen is soft, flat, nontender without masses or hepatosplenomegaly and peristalsis is normoactive. Extremities have 2+ pretibial edema, full range of motion is present. Skin is warm and dry without rash. Neurologic: Mental status is normal, cranial nerves are intact, there are no motor or sensory deficits.  ED Results / Procedures / Treatments   Labs (all labs ordered are listed, but only abnormal results are displayed) Labs Reviewed  COMPREHENSIVE METABOLIC PANEL - Abnormal; Notable for the following components:      Result Value   Chloride 115 (*)    CO2 21 (*)    Calcium 7.5 (*)    Total Protein 6.2 (*)    Albumin 3.4 (*)    Total Bilirubin 0.2 (*)    All other components within normal limits  BRAIN NATRIURETIC PEPTIDE  CBC WITH DIFFERENTIAL/PLATELET  URINALYSIS, ROUTINE W REFLEX MICROSCOPIC  EKG EKG Interpretation  Date/Time:  Saturday November 20 2020 04:54:46 EST Ventricular Rate:  73 PR Interval:  136 QRS Duration: 88 QT Interval:  374 QTC Calculation: 412 R Axis:   29 Text Interpretation: Normal sinus rhythm Anterior infarct , age undetermined Abnormal ECG When compared with ECG of 11/08/2020, HEART RATE has decreased Confirmed by Dione Booze ([id number]) on 11/20/2020 5:32:55 AM   Radiology DG Chest 2 View  Result Date: 11/20/2020 CLINICAL DATA:  Peripheral edema and leg swelling. EXAM: CHEST - 2 VIEW COMPARISON:  11/07/2020 FINDINGS: The heart size and mediastinal contours are within normal limits. Both lungs are clear. The visualized skeletal structures are unremarkable. IMPRESSION: No active  cardiopulmonary disease. Electronically Signed   By: Signa Kell M.D.   On: 11/20/2020 05:43    Procedures Procedures   Medications Ordered in ED Medications - No data to display  ED Course  I have reviewed the triage vital signs and the nursing notes.  Pertinent labs & imaging results that were available during my care of the patient were reviewed by me and considered in my medical decision making (see chart for details).  MDM Rules/Calculators/A&P New onset edema.  Will check hepatic function panel to look for evidence of hepatic or renal dysfunction, urinalysis to look for proteinuria, chest x-ray and BNP to look for evidence of heart failure.  ECG is unremarkable and unchanged from prior.  Urinalysis shows no proteinuria.  Renal function is normal and there is only minimal hypoalbuminemia which is not sufficient to account for his edema.  Chest x-ray shows no evidence of heart failure and BNP is normal.  Cause for edema is not clear, will send home with a prescription for 2-week supply of furosemide and given outpatient resources to try to establish primary care.  Advised to stay on a low-salt diet.  Final Clinical Impression(s) / ED Diagnoses Final diagnoses:  Peripheral edema    Rx / DC Orders ED Discharge Orders         Ordered    furosemide (LASIX) 40 MG tablet  Daily        11/20/20 0714           Dione Booze, MD 11/20/20 [phone]

## 2021-03-02 ENCOUNTER — Emergency Department (HOSPITAL_COMMUNITY)
Admission: EM | Admit: 2021-03-02 | Discharge: 2021-03-02 | Disposition: A | Discharge status: Home / Self Care | Payer: Self-pay | Attending: Emergency Medicine | Admitting: Emergency Medicine

## 2021-03-02 ENCOUNTER — Other Ambulatory Visit: Payer: Self-pay

## 2021-03-02 DIAGNOSIS — F17210 Nicotine dependence, cigarettes, uncomplicated: Secondary | ICD-10-CM | POA: Insufficient documentation

## 2021-03-02 DIAGNOSIS — Z20822 Contact with and (suspected) exposure to covid-19: Secondary | ICD-10-CM

## 2021-03-02 DIAGNOSIS — Z28310 Unvaccinated for covid-19: Secondary | ICD-10-CM | POA: Insufficient documentation

## 2021-03-02 DIAGNOSIS — J45909 Unspecified asthma, uncomplicated: Secondary | ICD-10-CM | POA: Insufficient documentation

## 2021-03-02 DIAGNOSIS — U071 COVID-19: Secondary | ICD-10-CM | POA: Insufficient documentation

## 2021-03-02 LAB — SARS CORONAVIRUS 2 (TAT 6-24 HRS): SARS Coronavirus 2: POSITIVE — AB

## 2021-03-02 MED ORDER — BENZONATATE 100 MG PO CAPS: 100.0000 mg | ORAL_CAPSULE | Freq: Three times a day (TID) | ORAL | 0 refills | Status: DC

## 2021-03-02 MED ORDER — ACETAMINOPHEN 500 MG PO TABS: 1000.0000 mg | ORAL_TABLET | Freq: Four times a day (QID) | ORAL | 0 refills | Status: DC | PRN

## 2021-03-02 NOTE — ED Provider Notes (Signed)
[facility] EMERGENCY DEPARTMENT Provider Note   CSN: [id number] Arrival date & time: 03/02/21  1435     History No chief complaint on file.   Steven Hubbard is a 45 y.o. male.  The history is provided by the patient and medical records. No language interpreter was used.     45 year old male here with covid sxs.  Pt report he has fever, chills, congestion, cough, body aches and overall not feeling well for about 3-4 days.  Report recent exposure to someone with covid.  Pt have not been vaccinated.  No specific treatment tried.  No significant sob. Pt is a smoker.  He missed work today.  Does have hx of asthma.    Past Medical History:  Diagnosis Date  . Asthma     There are no problems to display for this patient.   No past surgical history on file.     No family history on file.  Social History   Tobacco Use  . Smoking status: Current Every Day Smoker    Packs/day: 0.15    Years: 1.00    Pack years: 0.15    Types: Cigarettes  . Smokeless tobacco: Never Used  Vaping Use  . Vaping Use: Never used  Substance Use Topics  . Alcohol use: Yes    Comment: occasioanly  . Drug use: Yes    Types: Marijuana    Comment: occasionally    Home Medications Prior to Admission medications   Medication Sig Start Date End Date Taking? Authorizing Provider  furosemide (LASIX) 40 MG tablet Take 1 tablet (40 mg total) by mouth daily. 11/20/20   Dione Booze, MD  hydrochlorothiazide (HYDRODIURIL) 12.5 MG tablet Take 1 tablet (12.5 mg total) by mouth daily. Patient not taking: Reported on 11/20/2020 11/07/20   Cristina Gong, PA-C    Allergies    Patient has no known allergies.  Review of Systems   Review of Systems  All other systems reviewed and are negative.   Physical Exam Updated Vital Signs BP (!) 134/99 (BP Location: Right Arm)   Pulse 90   Temp 99.2 F (37.3 C) (Oral)   Resp 16   SpO2 99%   Physical Exam Vitals and nursing note reviewed.   Constitutional:      General: He is not in acute distress.    Appearance: He is well-developed.  HENT:     Head: Atraumatic.     Nose: Nose normal.     Mouth/Throat:     Mouth: Mucous membranes are moist.  Eyes:     Extraocular Movements: Extraocular movements intact.     Conjunctiva/sclera: Conjunctivae normal.     Pupils: Pupils are equal, round, and reactive to light.  Cardiovascular:     Rate and Rhythm: Normal rate and regular rhythm.     Pulses: Normal pulses.     Heart sounds: Normal heart sounds.  Pulmonary:     Effort: Pulmonary effort is normal.     Breath sounds: Normal breath sounds. No wheezing, rhonchi or rales.  Abdominal:     General: Abdomen is flat.     Palpations: Abdomen is soft.     Tenderness: There is no abdominal tenderness.  Musculoskeletal:     Cervical back: Normal range of motion and neck supple. No rigidity.  Skin:    Findings: No rash.  Neurological:     Mental Status: He is alert and oriented to person, place, and time.  Psychiatric:  Mood and Affect: Mood normal.     ED Results / Procedures / Treatments   Labs (all labs ordered are listed, but only abnormal results are displayed) Labs Reviewed - No data to display  EKG None  Radiology No results found.  Procedures Procedures   Medications Ordered in ED Medications - No data to display  ED Course  I have reviewed the triage vital signs and the nursing notes.  Pertinent labs & imaging results that were available during my care of the patient were reviewed by me and considered in my medical decision making (see chart for details).    MDM Rules/Calculators/A&P                          BP (!) 134/99 (BP Location: Right Arm)   Pulse 90   Temp 99.2 F (37.3 C) (Oral)   Resp 16   SpO2 99%   Final Clinical Impression(s) / ED Diagnoses Final diagnoses:  Suspected COVID-19 virus infection    Rx / DC Orders ED Discharge Orders         Ordered    acetaminophen  (TYLENOL) 500 MG tablet  Every 6 hours PRN        03/02/21 1544    benzonatate (TESSALON) 100 MG capsule  Every 8 hours        03/02/21 1544         3:43 PM Pt here with covid sxs.  Pt overall well appearing, no hypoxia.  COVID test ordered, pt can check result through MyChart.  Work note provided.    Steven Hubbard was evaluated in Emergency Department on 03/02/2021 for the symptoms described in the history of present illness. He was evaluated in the context of the global COVID-19 pandemic, which necessitated consideration that the patient might be at risk for infection with the SARS-CoV-2 virus that causes COVID-19. Institutional protocols and algorithms that pertain to the evaluation of patients at risk for COVID-19 are in a state of rapid change based on information released by regulatory bodies including the CDC and federal and state organizations. These policies and algorithms were followed during the patient's care in the ED.    Fayrene Helper, PA-C 03/02/21 1546    Milagros Loll, MD 03/03/21 [phone]

## 2021-03-02 NOTE — ED Triage Notes (Signed)
Pt reports body aches, headache, n/v yesterday, and fever. Pt's girlfriend has covid.

## 2021-03-02 NOTE — Discharge Instructions (Signed)
You have been screened for covid infection.  Check result through MyChart.  If positive, follow instruction below.   Recommendations for at home COVID-19 symptoms management:  Please continue isolation at home. Call [phone] to see whether you might be eligible for therapeutic antibody infusions (leave your name and they will call you back).  If have acute worsening of symptoms please go to ER/urgent care for further evaluation. Check pulse oximetry and if below 90-92% please go to ER. The following supplements MAY help:  Vitamin C 500mg  twice a day and Quercetin 250-500 mg twice a day Vitamin D3 2000 - 4000 u/day B Complex vitamins Zinc 75-100 mg/day Melatonin 6-10 mg at night (the optimal dose is unknown) Aspirin 81mg /day (if no history of bleeding issues)

## 2022-01-18 ENCOUNTER — Emergency Department (HOSPITAL_COMMUNITY)
Admission: EM | Admit: 2022-01-18 | Discharge: 2022-01-19 | Disposition: A | Discharge status: Home / Self Care | Payer: Self-pay | Attending: Emergency Medicine | Admitting: Emergency Medicine

## 2022-01-18 ENCOUNTER — Emergency Department (HOSPITAL_COMMUNITY): Payer: Self-pay

## 2022-01-18 ENCOUNTER — Other Ambulatory Visit: Payer: Self-pay

## 2022-01-18 DIAGNOSIS — T50901A Poisoning by unspecified drugs, medicaments and biological substances, accidental (unintentional), initial encounter: Secondary | ICD-10-CM

## 2022-01-18 DIAGNOSIS — F17210 Nicotine dependence, cigarettes, uncomplicated: Secondary | ICD-10-CM | POA: Insufficient documentation

## 2022-01-18 DIAGNOSIS — T401X1A Poisoning by heroin, accidental (unintentional), initial encounter: Secondary | ICD-10-CM | POA: Insufficient documentation

## 2022-01-18 LAB — BASIC METABOLIC PANEL
Anion gap: 8 (ref 5–15)
BUN: 13 mg/dL (ref 6–20)
CO2: 23 mmol/L (ref 22–32)
Calcium: 8.7 mg/dL — ABNORMAL LOW (ref 8.9–10.3)
Chloride: 108 mmol/L (ref 98–111)
Creatinine, Ser: 0.92 mg/dL (ref 0.61–1.24)
GFR, Estimated: >60 mL/min (ref >60)
Glucose, Bld: 102 mg/dL — ABNORMAL HIGH (ref 70–99)
Potassium: 3.3 mmol/L — ABNORMAL LOW (ref 3.5–5.1)
Sodium: 139 mmol/L (ref 135–145)

## 2022-01-18 LAB — CBC
HCT: 42.9 % (ref 39.0–52.0)
Hemoglobin: 14.2 g/dL (ref 13.0–17.0)
MCH: 30.1 pg (ref 26.0–34.0)
MCHC: 33.1 g/dL (ref 30.0–36.0)
MCV: 90.9 fL (ref 80.0–100.0)
Platelets: 211 10*3/uL (ref 150–400)
RBC: 4.72 MIL/uL (ref 4.22–5.81)
RDW: 13.2 % (ref 11.5–15.5)
WBC: 5.1 10*3/uL (ref 4.0–10.5)
nRBC: 0 % (ref 0.0–0.2)

## 2022-01-18 LAB — ETHANOL: Alcohol, Ethyl (B): 13 mg/dL — ABNORMAL HIGH (ref <10)

## 2022-01-18 NOTE — ED Triage Notes (Signed)
Patient BIB EMS for overdose . Patient was found semiconscious in a truck .EMS called , when entered in the ambulance truck patient became unconscious with snoring respirations .  1mg  of  Narcan and 4mg  of zofran given . Patient then  became responsive .Denies medical h/x . 18g inserted to the right a/c by EMS.  ?

## 2022-01-18 NOTE — ED Triage Notes (Signed)
[place]. Pt unintentionally OD after insufflation of what they though was heroin. Pt became nonresponsive and partner called EMS. Resp 8 upon EMS arrival, 1 mg Narcan administered. Pt immediately responsive and resp 18. ? ?BP: 150/100 ?HR: 96 ?RR: 18 ?SPO2: 100 ?

## 2022-01-18 NOTE — ED Provider Notes (Signed)
?[facility]-EMERGENCY DEPT ?Provider Note ? ? ?CSN: [id number] ?Arrival date & time: 01/18/22  2140 ? ?  ? ?History ? ?Chief Complaint  ?Patient presents with  ? Drug Overdose  ? ? ?Steven Hubbard is a 46 y.o. male  who presents as a level 1 in the EMS with concern for unintentional overdose.  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  States began to feel as though he was going to passed out, felt like he was "going in and out" and then he lost consciousness.  His girlfriend called EMS.  Respirations were 8 upon EMS arrival. ?Per EMS patient was administered 1 mg Narcan and 4 mg of Zofran with subsequent improvement in his mental status, became responsive. Sates he feels normal now. ? ?He endorses drinking 1/2 bottle of rum this evening, smoking cigarettes.  ? ?Level 5 caveat due to patient's acuity at time of presentation. Concomitantly evaluated by EDP at the bedside.  ? ?I personally reviewed this patient's medical records.  He does not carry medical diagnoses aside from asthma. States concern he may have hypertension.  ? ?HPI ? ?  ? ?Home Medications ?Prior to Admission medications   ?Medication Sig Start Date End Date Taking? Authorizing Provider  ?amLODipine (NORVASC) 5 MG tablet Take 1 tablet (5 mg total) by mouth daily. 01/19/22  Yes Azelyn Batie, Eugene Gavia, PA-C  ?naloxone (NARCAN) nasal spray 4 mg/0.1 mL Place 1 spray into the nose once as needed for up to 1 dose (Drug overdose). Administer 1 spray into the nostril for respiratory depression secondary to drug overdose.  You may repeat every 2 minutes in alternating nostrils as needed until medical assistance arrives or the patient is alert and talking. 01/19/22  Yes Tuff Clabo, Eugene Gavia, PA-C  ?acetaminophen (TYLENOL) 500 MG tablet Take 2 tablets (1,000 mg total) by mouth every 6 (six) hours as needed for fever. 03/02/21   Fayrene Helper, PA-C  ?benzonatate (TESSALON) 100  MG capsule Take 1 capsule (100 mg total) by mouth every 8 (eight) hours. 03/02/21   Fayrene Helper, PA-C  ?furosemide (LASIX) 40 MG tablet Take 1 tablet (40 mg total) by mouth daily. 11/20/20   Dione Booze, MD  ?hydrochlorothiazide (HYDRODIURIL) 12.5 MG tablet Take 1 tablet (12.5 mg total) by mouth daily. ?Patient not taking: Reported on 11/20/2020 11/07/20   Cristina Gong, PA-C  ?   ? ?Allergies    ?Patient has no known allergies.   ? ?Review of Systems   ?Review of Systems  ?Unable to perform ROS: Acuity of condition  ? ?Physical Exam ?Updated Vital Signs ?BP (!) 156/118   Pulse 75   Temp 97.7 ?F (36.5 ?C) (Oral)   Resp 13   SpO2 97%  ?Physical Exam ?Vitals and nursing note reviewed.  ?Constitutional:   ?   Appearance: He is not ill-appearing or toxic-appearing.  ?HENT:  ?   Head: Normocephalic and atraumatic.  ?   Mouth/Throat:  ?   Mouth: Mucous membranes are moist.  ?   Pharynx: Oropharynx is clear. No oropharyngeal exudate or posterior oropharyngeal erythema.  ?Eyes:  ?   General: Lids are normal. Vision grossly intact.     ?   Right eye: No discharge.     ?   Left eye: No discharge.  ?   Extraocular Movements: Extraocular movements intact.  ?   Conjunctiva/sclera: Conjunctivae normal.  ?   Pupils:  Pupils are equal, round, and reactive to light.  ?   Comments: Pupils 4mm bilaterally  ?Cardiovascular:  ?   Rate and Rhythm: Normal rate and regular rhythm.  ?   Pulses: Normal pulses.  ?   Heart sounds: Normal heart sounds. No murmur heard. ?Pulmonary:  ?   Effort: Pulmonary effort is normal. No respiratory distress.  ?   Breath sounds: Normal breath sounds. No wheezing or rales.  ?Abdominal:  ?   General: Bowel sounds are normal. There is no distension.  ?   Palpations: Abdomen is soft.  ?   Tenderness: There is no abdominal tenderness. There is no right CVA tenderness, left CVA tenderness, guarding or rebound.  ?Musculoskeletal:     ?   General: No deformity.  ?   Cervical back: Neck supple.  ?   Right lower  leg: No edema.  ?   Left lower leg: No edema.  ?Skin: ?   General: Skin is warm and dry.  ?   Capillary Refill: Capillary refill takes less than 2 seconds.  ?Neurological:  ?   General: No focal deficit present.  ?   Mental Status: He is alert and oriented to person, place, and time. Mental status is at baseline.  ?Psychiatric:     ?   Mood and Affect: Mood normal.  ? ? ?ED Results / Procedures / Treatments   ?Labs ?(all labs ordered are listed, but only abnormal results are displayed) ?Labs Reviewed  ?ETHANOL - Abnormal; Notable for the following components:  ?    Result Value  ? Alcohol, Ethyl (B) 13 (*)   ? All other components within normal limits  ?BASIC METABOLIC PANEL - Abnormal; Notable for the following components:  ? Potassium 3.3 (*)   ? Glucose, Bld 102 (*)   ? Calcium 8.7 (*)   ? All other components within normal limits  ?CBC  ? ? ?EKG ?EKG Interpretation ? ?Date/Time:  Wednesday January 18 2022 21:52:56 EDT ?Ventricular Rate:  83 ?PR Interval:  141 ?QRS Duration: 97 ?QT Interval:  379 ?QTC Calculation: 446 ?R Axis:   7 ?Text Interpretation: Sinus rhythm Anterior infarct, old Minimal ST elevation, inferior leads Confirmed by Marily MemosMesner, Jason [phone]([id number]) on 01/19/2022 12:01:06 AM ? ?Radiology ?DG Chest Portable 1 View ? ?Result Date: 01/18/2022 ?CLINICAL DATA:  Overdose.  Concern for aspiration. EXAM: PORTABLE CHEST 1 VIEW COMPARISON:  Chest radiograph dated 11/20/2020. FINDINGS: The heart size and mediastinal contours are within normal limits. Both lungs are clear. The visualized skeletal structures are unremarkable. IMPRESSION: No active disease. Electronically Signed   By: Elgie CollardArash  Radparvar M.D.   On: 01/18/2022 22:39   ? ?Procedures ?Marland Kitchen.Critical Care ?Performed by: Paris LoreSponseller, Gretna Bergin R, PA-C ?Authorized by: Paris LoreSponseller, Tayvin Preslar R, PA-C  ? ?Critical care provider statement:  ?  Critical care time (minutes):  45 ?  Critical care was time spent personally by me on the following activities:  Development of treatment  plan with patient or surrogate, discussions with consultants, evaluation of patient's response to treatment, examination of patient, obtaining history from patient or surrogate, ordering and performing treatments and interventions, ordering and review of laboratory studies, ordering and review of radiographic studies, pulse oximetry and re-evaluation of patient's condition  ? ? ?Medications Ordered in ED ?Medications - No data to display ? ?ED Course/ Medical Decision Making/ A&P ?Clinical Course as of 01/19/22 0147  ?Wed Jan 18, 2022  ?2215 This is a 46 year old male present emerged department with report of possible drug  overdose.  The patient reports that he snorted a powder today, was not certain was in it but thinks it may have been fentanyl.  He does not recall what happened afterwards.  A bystander girlfriend called EMS and the patient was found semiconscious in his truck.  He was given Narcan and Zofran.  He then became responsive.  He feels completely normal and back to baseline in the ED.  He denies nausea.  He says that this was "a huge mistake" and he plans to avoid using any of the substances in the future.  He is well-appearing on exam, discontinued of pinpoint pupils but has a GCS of 15, is breathing comfortably with no hypoxia.  We will get a chest x-ray to evaluate for possible aspiration.  We will monitor him for a period of approximately 3 hours to ensure no rebound narcosis with the narcan wears off [MT]  ?2310 Potassium(!): 3.3 [RS]  ?  ?Clinical Course User Index ?[MT] Terald Sleeper, MD ?[RS] Jalyn Dutta, Eugene Gavia, PA-C  ? ?                        ?Medical Decision Making ?46 year old male who presented via EMS after accidental overdose responsive to Narcan en route. ? ? ?Hypertensive on intake, vital signs otherwise normal.  Cardiopulmonary and abdominal exams are benign.  Patient is neurovascular intact in all extremities and has a GCS of 15 at this time.  He states he is feeling very  well at this time, remorseful of his usage of drugs this evening..  Intermittently with hiccups. ? ?Amount and/or Complexity of Data Reviewed ?Labs: ordered. Decision-making details documented in ED Course. ?

## 2022-01-19 MED ORDER — AMLODIPINE BESYLATE 5 MG PO TABS: 5.0000 mg | ORAL_TABLET | Freq: Every day | ORAL | 0 refills | Status: DC

## 2022-01-19 MED ORDER — NALOXONE HCL 4 MG/0.1ML NA LIQD: 1.0000 | Freq: Once | NASAL | 0 refills | Status: DC | PRN

## 2022-01-19 NOTE — Discharge Instructions (Addendum)
You are seen in the ER today after drug overdose.  Your physical exam, vital signs, blood work, and chest x-ray were reassuring.  Please do not use illicit drugs beyond this point.  You have been prescribed intranasal Narcan which you may pick up and carry with you in the event of potential accidental overdose again in the future.  Return to ER with any severe symptoms. ?

## 2022-04-20 IMAGING — CT CT ANGIO CHEST-ABD-PELV FOR DISSECTION W/ AND WO/W CM
2 of 7 series · 13 of 46 positions shown, 15 images · IV contrast (APPLIED)
Comparison: None.

CLINICAL DATA: Chest pain and right lateral side pain for several
hours

EXAM:
CT ANGIOGRAPHY CHEST, ABDOMEN AND PELVIS
TECHNIQUE: Non-contrast CT of the chest was initially obtained.

[Series 6: arterial · axial · arterial · 0.80mm/px · z∈[+772,+1372]mm · 10 of 338 slices shown, 12 images]
[im 19/338  soft-tissue]
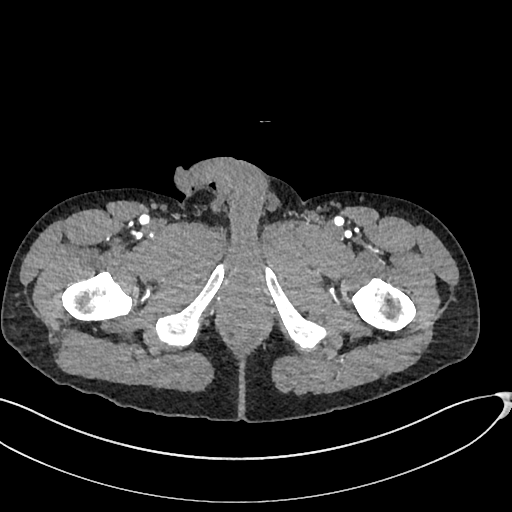
[im 19/338  bone]
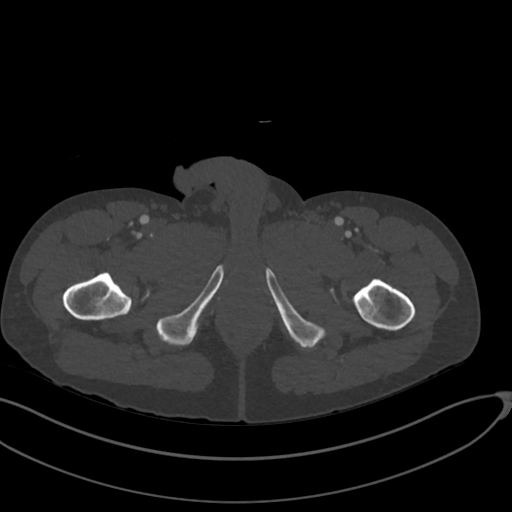
[im 57/338  soft-tissue]
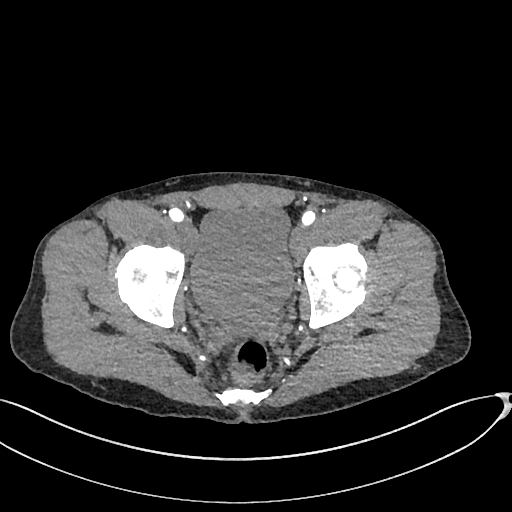
[im 94/338  soft-tissue]
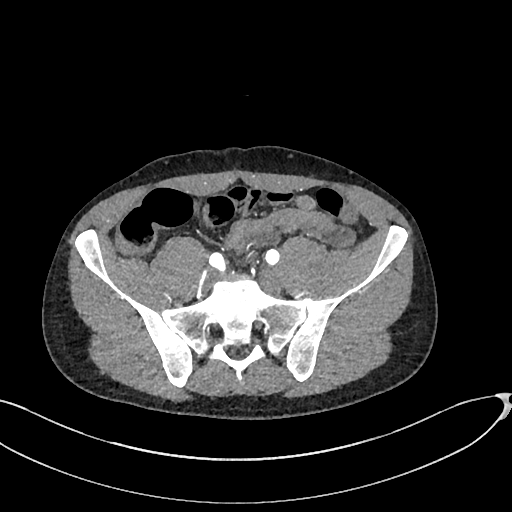
[im 113/338  soft-tissue]
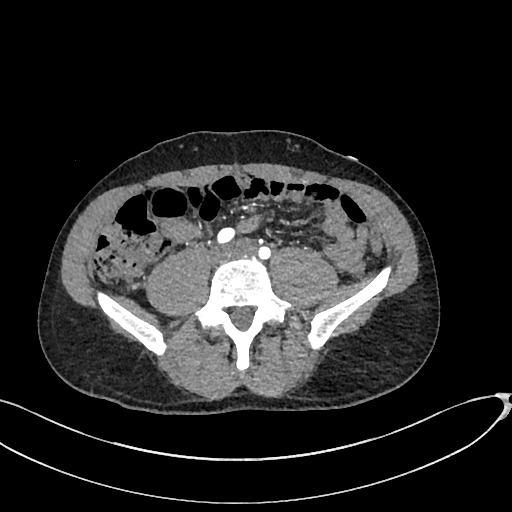
[im 150/338  soft-tissue]
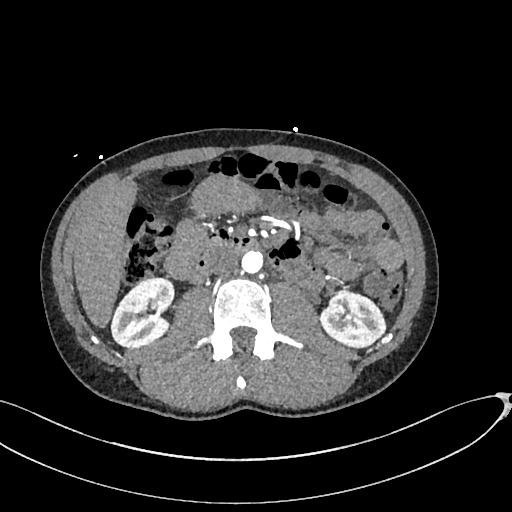
[im 188/338  soft-tissue]
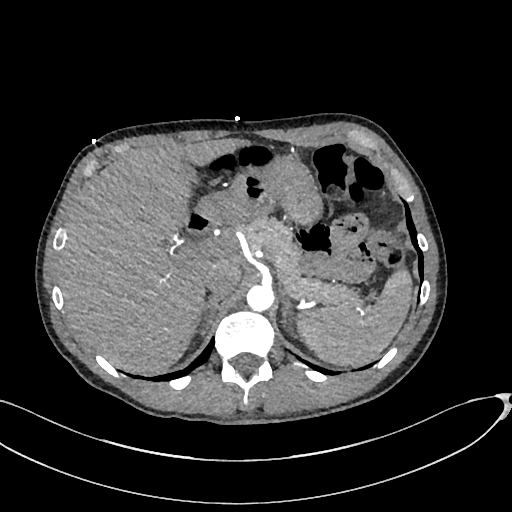
[im 225/338  soft-tissue]
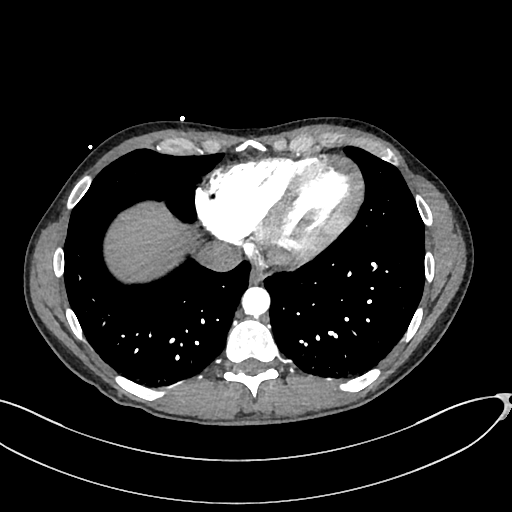
[im 244/338  soft-tissue]
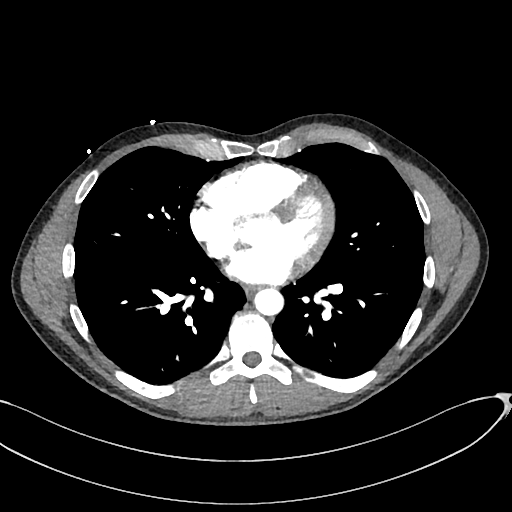
[im 281/338  soft-tissue]
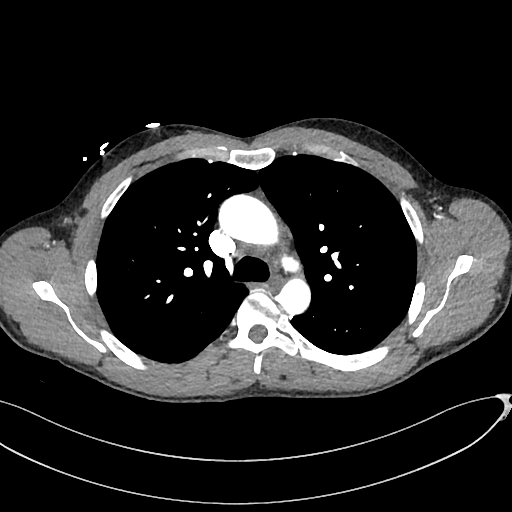
[im 281/338  bone]
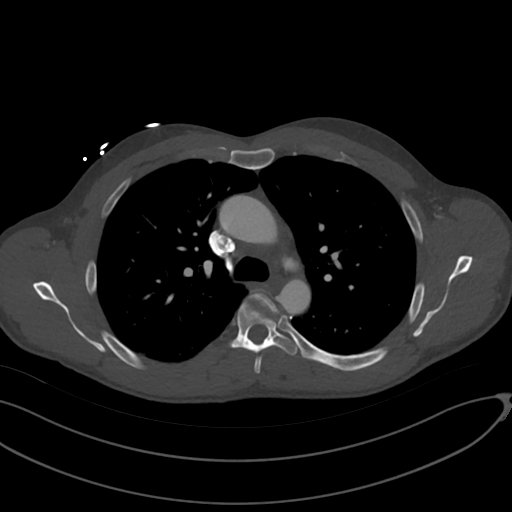
[im 319/338  soft-tissue]
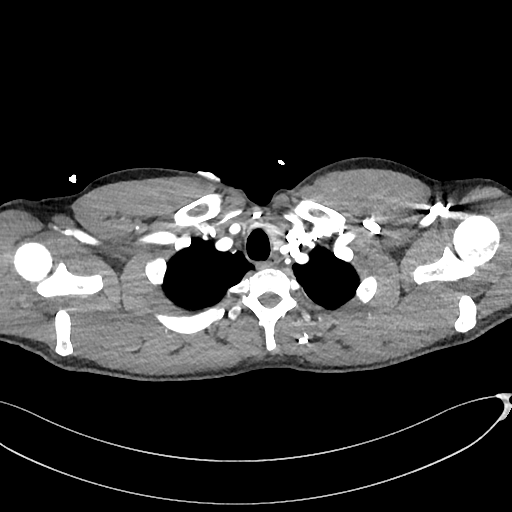

[Series 9: cor · coronal · 0.75mm/px · 3 of 151 slices shown]
[im 38/151  soft-tissue]
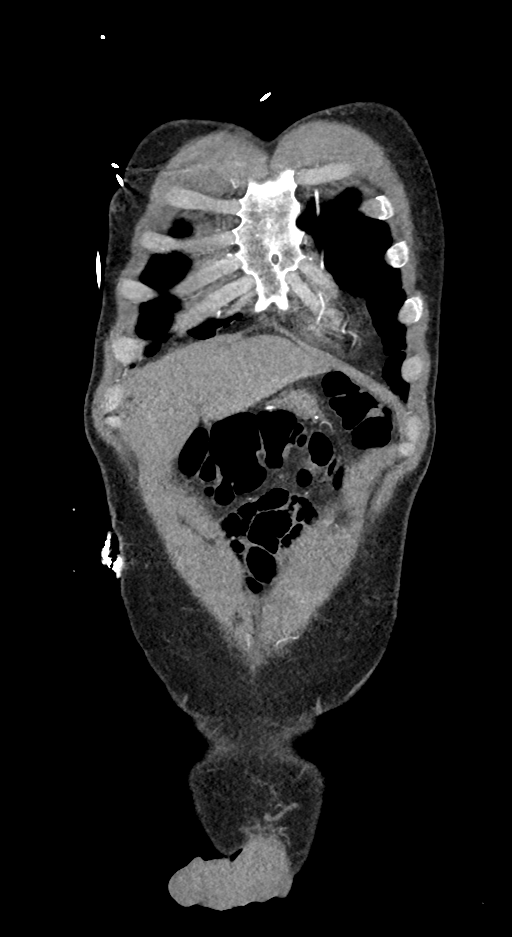
[im 76/151  soft-tissue]
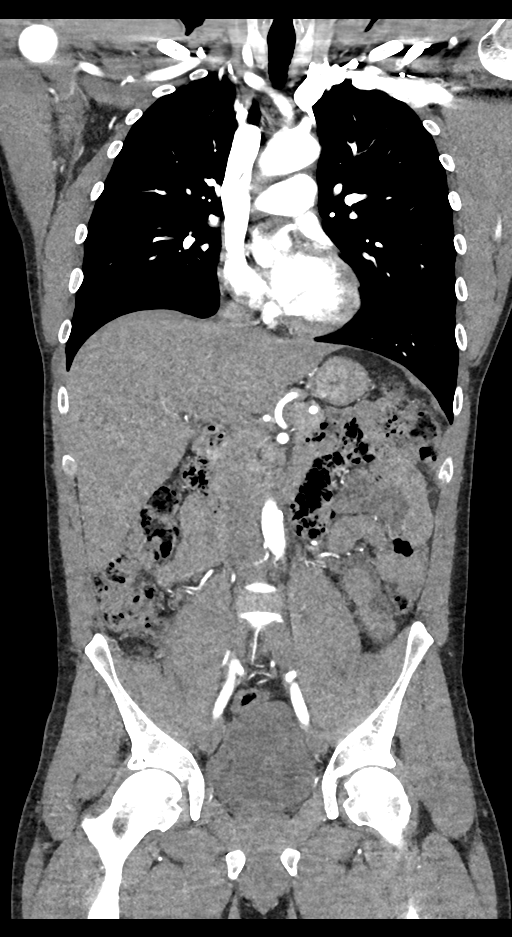
[im 113/151  soft-tissue]
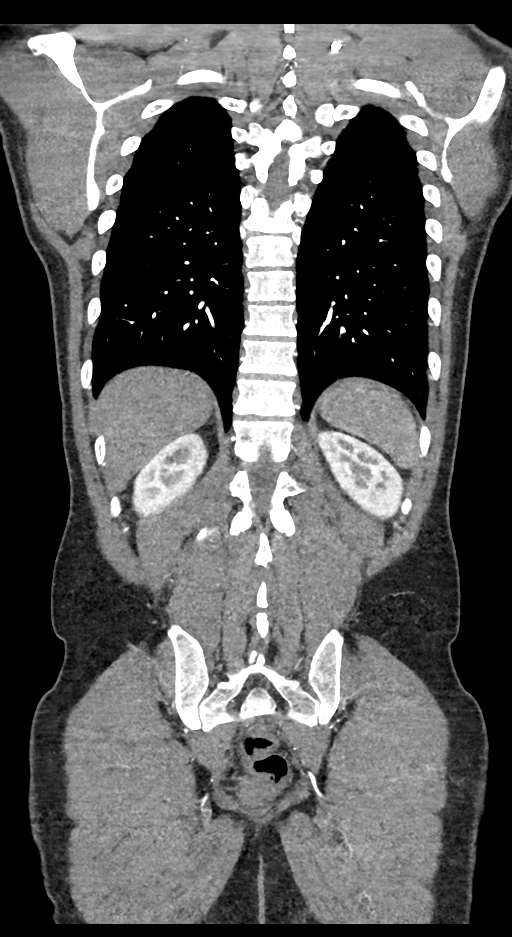

[13 of 46 positions shown; findings below may reference images not displayed]

Multidetector CT imaging through the chest, abdomen and pelvis was
performed using the standard protocol during bolus administration of
intravenous contrast. Multiplanar reconstructed images and MIPs were
obtained and reviewed to evaluate the vascular anatomy.

CONTRAST:  100mL OMNIPAQUE IOHEXOL 350 MG/ML SOLN
FINDINGS: CTA CHEST FINDINGS

Cardiovascular: Preferential opacification of the thoracic aorta. No
evidence of thoracic aortic aneurysm or dissection. Normal heart
size. No pericardial effusion. No central pulmonary embolus.

Mediastinum/Nodes: No enlarged mediastinal, hilar, or axillary lymph
nodes. Thyroid gland, trachea, and esophagus demonstrate no
significant findings.

Lungs/Pleura: Bibasilar atelectasis. No focal consolidation. No
pleural effusion or pneumothorax.

Musculoskeletal: No chest wall abnormality. No acute or significant
osseous findings.

Review of the MIP images confirms the above findings.

CTA ABDOMEN AND PELVIS FINDINGS

VASCULAR

Aorta: Normal caliber aorta without aneurysm, dissection, vasculitis
or significant stenosis.

Celiac: Patent without evidence of aneurysm, dissection, vasculitis
or significant stenosis.

SMA: Patent without evidence of aneurysm, dissection, vasculitis or
significant stenosis.

Choose 1

Renals: Both renal arteries are patent without evidence of aneurysm,
dissection, vasculitis, fibromuscular dysplasia or significant
stenosis.

IMA: Patent without evidence of aneurysm, dissection, vasculitis or
significant stenosis.

Inflow: Patent without evidence of aneurysm, dissection, vasculitis
or significant stenosis.

Veins: No obvious venous abnormality within the limitations of this
arterial phase study.

Review of the MIP images confirms the above findings.

NON-VASCULAR

Hepatobiliary: No focal liver abnormality is seen. No gallstones,
gallbladder wall thickening, or biliary dilatation.

Pancreas: Unremarkable. No pancreatic ductal dilatation or
surrounding inflammatory changes.

Spleen: Normal in size without focal abnormality.

Adrenals/Urinary Tract: Adrenal glands are unremarkable. Kidneys are
normal, without renal calculi, focal lesion, or hydronephrosis.
Bladder is unremarkable.

Stomach/Bowel: Stomach is within normal limits. Appendix appears
normal. No evidence of bowel wall thickening, distention, or
inflammatory changes.

Lymphatic: No adenopathy.

Reproductive: Prostate is unremarkable.

Other: No abdominal wall hernia or abnormality. No abdominopelvic
ascites.

Musculoskeletal: No acute or significant osseous findings. Left
iliac bone island.

Review of the MIP images confirms the above findings.
IMPRESSION: 1. No evidence of aortic aneurysm or dissection.
2. No acute intrathoracic or intra-abdominal pathology.

## 2022-07-06 ENCOUNTER — Emergency Department (HOSPITAL_COMMUNITY)
Admission: EM | Admit: 2022-07-06 | Discharge: 2022-07-06 | Discharge status: Against Medical Advice | Payer: Self-pay | Attending: Student | Admitting: Student

## 2022-07-06 ENCOUNTER — Encounter (HOSPITAL_COMMUNITY): Payer: Self-pay

## 2022-07-06 DIAGNOSIS — Z5321 Procedure and treatment not carried out due to patient leaving prior to being seen by health care provider: Secondary | ICD-10-CM | POA: Insufficient documentation

## 2022-07-06 DIAGNOSIS — M7918 Myalgia, other site: Secondary | ICD-10-CM | POA: Insufficient documentation

## 2022-07-06 DIAGNOSIS — F10120 Alcohol abuse with intoxication, uncomplicated: Secondary | ICD-10-CM | POA: Insufficient documentation

## 2022-07-06 DIAGNOSIS — Y909 Presence of alcohol in blood, level not specified: Secondary | ICD-10-CM | POA: Insufficient documentation

## 2022-07-06 DIAGNOSIS — R197 Diarrhea, unspecified: Secondary | ICD-10-CM | POA: Insufficient documentation

## 2022-07-06 DIAGNOSIS — R112 Nausea with vomiting, unspecified: Secondary | ICD-10-CM | POA: Insufficient documentation

## 2022-07-06 NOTE — ED Provider Triage Note (Signed)
Emergency Medicine Provider Triage Evaluation Note  Steven Hubbard , a 46 y.o. male  was evaluated in triage.   Pt complains of drug intoxication.  Reports that she snorted fentanyl last night around midnight and then took Suboxone a few hours ago and now feels sick.  Nausea, vomiting, diarrhea and body aches.  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  Maybe 2 shots every other day  Review of Systems  Positive: Nausea, vomiting, diarrhea and body aches Negative: Chest pain or shortness of breath  Physical Exam  BP (!) 134/109 (BP Location: Left Arm)   Pulse 73   Temp 98.2 F (36.8 C) (Oral)   SpO2 100%  Gen:   Awake, no distress   Resp:  Normal effort  MSK:   Moves extremities without difficulty  Other:    Medical Decision Making  Medically screening exam initiated at 6:42 PM.  Appropriate orders placed.  Steven Hubbard was informed that the remainder of the evaluation will be completed by another provider, this initial triage assessment does not replace that evaluation, and the importance of remaining in the ED until their evaluation is complete.     Steven Hammock, PA-C 07/06/22 1843

## 2022-07-06 NOTE — ED Triage Notes (Signed)
Pt presents with c/o withdrawal from approx 1/2 gram of fentanyl last night and suboxone after that. Pt c/o body aches and headache.

## 2023-09-19 ENCOUNTER — Emergency Department (HOSPITAL_COMMUNITY): Admission: EM | Admit: 2023-09-19 | Discharge: 2023-09-19 | Discharge status: Against Medical Advice | Payer: Self-pay

## 2023-09-19 NOTE — ED Notes (Signed)
 Pt called 3x and no answer, moved to OTF.

## 2023-09-20 ENCOUNTER — Emergency Department (HOSPITAL_COMMUNITY): Admission: EM | Admit: 2023-09-20 | Discharge: 2023-09-20 | Discharge status: Against Medical Advice | Payer: Self-pay

## 2023-09-20 NOTE — ED Notes (Signed)
Called for triage x 5 and unable to find. Taking pt otf

## 2023-09-20 NOTE — ED Notes (Signed)
Called x2 for triage no answer and noone in bathroom, will retry in 5 muns

## 2023-09-30 ENCOUNTER — Emergency Department (HOSPITAL_COMMUNITY): Admission: EM | Admit: 2023-09-30 | Discharge: 2023-09-30 | Discharge status: Against Medical Advice | Payer: Self-pay

## 2023-09-30 NOTE — ED Notes (Signed)
Pt not responding for triage  

## 2023-09-30 NOTE — ED Notes (Signed)
Pt not answering for triage 3x

## 2023-09-30 NOTE — ED Notes (Signed)
Called pt again, pt did not come.

## 2023-10-05 ENCOUNTER — Emergency Department (HOSPITAL_COMMUNITY)
Admission: EM | Admit: 2023-10-05 | Discharge: 2023-10-06 | Discharge status: Against Medical Advice | Payer: Self-pay | Attending: Emergency Medicine | Admitting: Emergency Medicine

## 2023-10-05 ENCOUNTER — Other Ambulatory Visit: Payer: Self-pay

## 2023-10-05 ENCOUNTER — Encounter (HOSPITAL_COMMUNITY): Payer: Self-pay | Admitting: *Deleted

## 2023-10-05 DIAGNOSIS — R2240 Localized swelling, mass and lump, unspecified lower limb: Secondary | ICD-10-CM | POA: Insufficient documentation

## 2023-10-05 DIAGNOSIS — Z5321 Procedure and treatment not carried out due to patient leaving prior to being seen by health care provider: Secondary | ICD-10-CM | POA: Insufficient documentation

## 2023-10-05 LAB — CBC
HCT: 39.2 % (ref 39.0–52.0)
Hemoglobin: 13.1 g/dL (ref 13.0–17.0)
MCH: 29.5 pg (ref 26.0–34.0)
MCHC: 33.4 g/dL (ref 30.0–36.0)
MCV: 88.3 fL (ref 80.0–100.0)
Platelets: 219 10*3/uL (ref 150–400)
RBC: 4.44 MIL/uL (ref 4.22–5.81)
RDW: 13.2 % (ref 11.5–15.5)
WBC: 6.4 10*3/uL (ref 4.0–10.5)
nRBC: 0 % (ref 0.0–0.2)

## 2023-10-05 LAB — COMPREHENSIVE METABOLIC PANEL
ALT: 16 U/L (ref 0–44)
AST: 21 U/L (ref 15–41)
Albumin: 3.7 g/dL (ref 3.5–5.0)
Alkaline Phosphatase: 75 U/L (ref 38–126)
Anion gap: 10 (ref 5–15)
BUN: 15 mg/dL (ref 6–20)
CO2: 26 mmol/L (ref 22–32)
Calcium: 8.9 mg/dL (ref 8.9–10.3)
Chloride: 103 mmol/L (ref 98–111)
Creatinine, Ser: 0.94 mg/dL (ref 0.61–1.24)
GFR, Estimated: >60 mL/min (ref >60)
Glucose, Bld: 91 mg/dL (ref 70–99)
Potassium: 4 mmol/L (ref 3.5–5.1)
Sodium: 139 mmol/L (ref 135–145)
Total Bilirubin: 0.5 mg/dL (ref 0.0–1.2)
Total Protein: 6.8 g/dL (ref 6.5–8.1)

## 2023-10-05 NOTE — ED Triage Notes (Signed)
 The pt reports that he has had fboth feet and leg swelling for approx one week  he walks a lot at work   no other symptoms

## 2023-10-06 NOTE — ED Notes (Signed)
 Pt did not respond for vitals after being called multiple times

## 2023-10-07 ENCOUNTER — Emergency Department (HOSPITAL_COMMUNITY)
Admission: EM | Admit: 2023-10-07 | Discharge: 2023-10-08 | Disposition: A | Discharge status: Home / Self Care | Payer: Self-pay | Attending: Emergency Medicine | Admitting: Emergency Medicine

## 2023-10-07 ENCOUNTER — Other Ambulatory Visit: Payer: Self-pay

## 2023-10-07 ENCOUNTER — Encounter (HOSPITAL_COMMUNITY): Payer: Self-pay

## 2023-10-07 DIAGNOSIS — R600 Localized edema: Secondary | ICD-10-CM | POA: Insufficient documentation

## 2023-10-07 DIAGNOSIS — J45909 Unspecified asthma, uncomplicated: Secondary | ICD-10-CM | POA: Insufficient documentation

## 2023-10-07 DIAGNOSIS — R0789 Other chest pain: Secondary | ICD-10-CM | POA: Insufficient documentation

## 2023-10-07 DIAGNOSIS — R0602 Shortness of breath: Secondary | ICD-10-CM | POA: Insufficient documentation

## 2023-10-07 MED ORDER — ALBUTEROL SULFATE HFA 108 (90 BASE) MCG/ACT IN AERS: 2.0000 | INHALATION_SPRAY | RESPIRATORY_TRACT | Status: DC | PRN

## 2023-10-07 NOTE — ED Triage Notes (Signed)
 Pt arrives via POV states that since he was here a few days ago he just feels like it is hard to catch his breath.

## 2023-10-08 ENCOUNTER — Emergency Department (HOSPITAL_COMMUNITY): Payer: Self-pay

## 2023-10-08 LAB — CBC WITH DIFFERENTIAL/PLATELET
Abs Immature Granulocytes: 0.02 10*3/uL (ref 0.00–0.07)
Basophils Absolute: 0 10*3/uL (ref 0.0–0.1)
Basophils Relative: 1 %
Eosinophils Absolute: 0.7 10*3/uL — ABNORMAL HIGH (ref 0.0–0.5)
Eosinophils Relative: 13 %
HCT: 37.3 % — ABNORMAL LOW (ref 39.0–52.0)
Hemoglobin: 12.3 g/dL — ABNORMAL LOW (ref 13.0–17.0)
Immature Granulocytes: 0 %
Lymphocytes Relative: 29 %
Lymphs Abs: 1.7 10*3/uL (ref 0.7–4.0)
MCH: 29.4 pg (ref 26.0–34.0)
MCHC: 33 g/dL (ref 30.0–36.0)
MCV: 89 fL (ref 80.0–100.0)
Monocytes Absolute: 0.6 10*3/uL (ref 0.1–1.0)
Monocytes Relative: 11 %
Neutro Abs: 2.7 10*3/uL (ref 1.7–7.7)
Neutrophils Relative %: 46 %
Platelets: 207 10*3/uL (ref 150–400)
RBC: 4.19 MIL/uL — ABNORMAL LOW (ref 4.22–5.81)
RDW: 13.3 % (ref 11.5–15.5)
WBC: 5.7 10*3/uL (ref 4.0–10.5)
nRBC: 0 % (ref 0.0–0.2)

## 2023-10-08 LAB — COMPREHENSIVE METABOLIC PANEL
ALT: 15 U/L (ref 0–44)
AST: 21 U/L (ref 15–41)
Albumin: 3.5 g/dL (ref 3.5–5.0)
Alkaline Phosphatase: 70 U/L (ref 38–126)
Anion gap: 9 (ref 5–15)
BUN: 12 mg/dL (ref 6–20)
CO2: 26 mmol/L (ref 22–32)
Calcium: 8.7 mg/dL — ABNORMAL LOW (ref 8.9–10.3)
Chloride: 102 mmol/L (ref 98–111)
Creatinine, Ser: 0.96 mg/dL (ref 0.61–1.24)
GFR, Estimated: >60 mL/min (ref >60)
Glucose, Bld: 101 mg/dL — ABNORMAL HIGH (ref 70–99)
Potassium: 4.1 mmol/L (ref 3.5–5.1)
Sodium: 137 mmol/L (ref 135–145)
Total Bilirubin: 0.4 mg/dL (ref 0.0–1.2)
Total Protein: 6.5 g/dL (ref 6.5–8.1)

## 2023-10-08 LAB — BRAIN NATRIURETIC PEPTIDE: B Natriuretic Peptide: 26.5 pg/mL (ref 0.0–100.0)

## 2023-10-08 LAB — TROPONIN I (HIGH SENSITIVITY): Troponin I (High Sensitivity): 2 ng/L (ref <18)

## 2023-10-08 NOTE — ED Notes (Signed)
 Lab called, initial troponin and BNP added to previous collections.

## 2023-10-08 NOTE — ED Notes (Signed)
 Went to assess pt. Pt not present. Per staff, pt left. PA notified.

## 2023-10-08 NOTE — ED Provider Notes (Signed)
 [place] EMERGENCY DEPARTMENT AT [facility] Provider Note   CSN: [id number] Arrival date & time: 10/07/23  2230     History  Chief Complaint  Patient presents with   Shortness of Breath    Steven Hubbard is a 48 y.o. male with past medical history of asthma (no inahler - last attack was as a kid) presents to emergency department for evaluation of intermittent shortness of breath with associated chest tightness for a week that typically occurs when he is waking up.  He also complains of bilateral pedal edema.  He endorses increased stress at home over the past month. He does not currently have chest tightness, SOB. He denies URI symptoms, fever, recent travel, hx of clots, hemoptysis.    Shortness of Breath Associated symptoms: no abdominal pain, no chest pain, no cough, no fever, no headaches, no vomiting and no wheezing      Home Medications Prior to Admission medications   Not on File      Allergies    Patient has no known allergies.    Review of Systems   Review of Systems  Constitutional:  Negative for chills, fatigue and fever.  Respiratory:  Positive for shortness of breath. Negative for cough, chest tightness and wheezing.   Cardiovascular:  Positive for leg swelling. Negative for chest pain and palpitations.  Gastrointestinal:  Negative for abdominal pain, constipation, diarrhea, nausea and vomiting.  Neurological:  Negative for dizziness, seizures, weakness, light-headedness, numbness and headaches.    Physical Exam Updated Vital Signs BP 113/77   Pulse (!) 56   Temp 97.7 F (36.5 C) (Oral)   Resp 18   Ht 5' 8 (1.727 m)   Wt 86.2 kg   SpO2 100%   BMI 28.89 kg/m  Physical Exam Vitals and nursing note reviewed.  Constitutional:      General: He is not in acute distress.    Appearance: Normal appearance. He is not ill-appearing or diaphoretic.  HENT:     Head: Normocephalic and atraumatic.  Eyes:     General: No scleral icterus.        Right eye: No discharge.        Left eye: No discharge.     Conjunctiva/sclera: Conjunctivae normal.  Cardiovascular:     Rate and Rhythm: Normal rate.     Pulses:          Dorsalis pedis pulses are 2+ on the right side and 2+ on the left side.  Pulmonary:     Effort: Pulmonary effort is normal. No respiratory distress.     Breath sounds: Normal breath sounds.     Comments: No tachypnea nor accessory muscles used Musculoskeletal:     Comments: Mild bilateral nonpitting pedal edema. (R worse than left) R calf tenderness with mild darkening of anterior calf. No overlying skin changes to indicate infection 5/5 strength to BLE. Ambulates without difficulty  Skin:    General: Skin is warm.     Capillary Refill: Capillary refill takes less than 2 seconds.     Coloration: Skin is not jaundiced or pale.  Neurological:     Mental Status: He is alert and oriented to person, place, and time. Mental status is at baseline.     Sensory: No sensory deficit.     Motor: No weakness.     Coordination: Coordination normal.     Gait: Gait normal.     Deep Tendon Reflexes: Reflexes normal.     ED Results /  Procedures / Treatments   Labs (all labs ordered are listed, but only abnormal results are displayed) Labs Reviewed  CBC WITH DIFFERENTIAL/PLATELET - Abnormal; Notable for the following components:      Result Value   RBC 4.19 (*)    Hemoglobin 12.3 (*)    HCT 37.3 (*)    Eosinophils Absolute 0.7 (*)    All other components within normal limits  COMPREHENSIVE METABOLIC PANEL - Abnormal; Notable for the following components:   Glucose, Bld 101 (*)    Calcium 8.7 (*)    All other components within normal limits  BRAIN NATRIURETIC PEPTIDE  TROPONIN I (HIGH SENSITIVITY)    EKG EKG Interpretation Date/Time:  Sunday October 07 2023 23:14:35 EST Ventricular Rate:  63 PR Interval:  154 QRS Duration:  88 QT Interval:  394 QTC Calculation: 403 R Axis:   3  Text Interpretation: Normal  sinus rhythm Normal ECG When compared with ECG of 06-Jul-2022 18:55, PREVIOUS ECG IS PRESENT since last tracing no significant change Confirmed by Lenor Hollering [id number]) on 10/08/2023 11:30:00 AM  Radiology DG Chest 2 View Result Date: 10/08/2023 CLINICAL DATA:  Cough, nasal congestion EXAM: CHEST - 2 VIEW COMPARISON:  01/18/2022 FINDINGS: The heart size and mediastinal contours are within normal limits. Both lungs are clear. The visualized skeletal structures are unremarkable. IMPRESSION: No active cardiopulmonary disease. Electronically Signed   By: Norman Gatlin M.D.   On: 10/08/2023 00:19    Procedures Procedures    Medications Ordered in ED Medications  albuterol  (VENTOLIN  HFA) 108 (90 Base) MCG/ACT inhaler 2 puff (has no administration in time range)    ED Course/ Medical Decision Making/ A&P                                 Medical Decision Making Amount and/or Complexity of Data Reviewed Labs: ordered. Radiology: ordered.  Risk Prescription drug management.   Patient presents to the ED for concern of sob, chest tightness, this involves an extensive number of treatment options, and is a complaint that carries with it a high risk of complications and morbidity.  The differential diagnosis includes ACS, PE, MSK, HF, PNA, PTX   Co morbidities that complicate the patient evaluation  Childhood asthma   Additional history obtained:  Additional history obtained from Family, Nursing, and Outside Medical Records   External records from outside source obtained and reviewed including  Family at bedside Triage RN note Recent medical evals and med list ED visit from 2022 with complaints of bilateral peripheral edema Workup neg D/c with short course of lasix  and PCP f/u   Lab Tests:  I Ordered, and personally interpreted labs.  The pertinent results include:   Hemoglobin 12.3.  Reports no hematochezia, melena, previous GI bleed - will have patient follow up with PCP for  recheck Glucose 101 Calcium 8.7 (baseline is 7.5-9.3 over past 8 years) Troponin WNL BNP 26.5   Imaging Studies ordered:  I ordered imaging studies including chest x-ray I independently visualized and interpreted imaging which showed no cardiopulmonary disease nor pneumonia I agree with the radiologist interpretation   Cardiac Monitoring:  The patient was maintained on a cardiac monitor.  I personally viewed and interpreted the cardiac monitored which showed an underlying rhythm of:  NSR with no STE, ischemia, nor RH strain    Problem List / ED Course:  SOB Chest tightness He reports mild dyspnea at rest but does not  appear in physical distress. No increased work of breathing nor tachypnea. Upon reassessment, patient is sleeping in bed Low suspicion for PE as his VS are WNL. No tachycardia, hypoxia, tachypnea. Troponin negative. EKG without right heart strain. Shared decision making had between getting dimer vs CTPA to r/o PE as etiology of SOB and chest tightness.   Pedal edema Reviewed note from 09/18/2021 regarding similar symptoms of peripheral edema noted to hands and feet.  Workup was negative.  He was recommended to follow-up with PCP and short Lasix  course. Patient does not recollect ED visit nor knows if Lasix  helped.  He reports that he did not get PCP follow-up DVT study pending Prior to obtaining scan and completion of work up, patient eloped from ED.   Reevaluation:  Eloped from ED prior to completion of ED workup and imaging   Social Determinants of Health:  No PCP follow-up  Dispostion:  Patient eloped prior to completion of workup  Final Clinical Impression(s) / ED Diagnoses Final diagnoses:  Shortness of breath  Chest tightness  Pedal edema    Rx / DC Orders ED Discharge Orders     None         Minnie Tinnie BRAVO, PA 10/08/23 1631    Levander Houston, MD 10/08/23 1718

## 2023-10-19 ENCOUNTER — Other Ambulatory Visit: Payer: Self-pay

## 2023-10-19 ENCOUNTER — Encounter (HOSPITAL_COMMUNITY): Payer: Self-pay | Admitting: *Deleted

## 2023-10-19 ENCOUNTER — Emergency Department (HOSPITAL_COMMUNITY)
Admission: EM | Admit: 2023-10-19 | Discharge: 2023-10-20 | Discharge status: Against Medical Advice | Payer: Self-pay | Attending: Emergency Medicine | Admitting: Emergency Medicine

## 2023-10-19 DIAGNOSIS — R0981 Nasal congestion: Secondary | ICD-10-CM | POA: Insufficient documentation

## 2023-10-19 DIAGNOSIS — R0989 Other specified symptoms and signs involving the circulatory and respiratory systems: Secondary | ICD-10-CM | POA: Insufficient documentation

## 2023-10-19 DIAGNOSIS — R059 Cough, unspecified: Secondary | ICD-10-CM | POA: Insufficient documentation

## 2023-10-19 DIAGNOSIS — Z20822 Contact with and (suspected) exposure to covid-19: Secondary | ICD-10-CM | POA: Insufficient documentation

## 2023-10-19 DIAGNOSIS — M7918 Myalgia, other site: Secondary | ICD-10-CM | POA: Insufficient documentation

## 2023-10-19 DIAGNOSIS — Z5329 Procedure and treatment not carried out because of patient's decision for other reasons: Secondary | ICD-10-CM | POA: Insufficient documentation

## 2023-10-19 DIAGNOSIS — Z5321 Procedure and treatment not carried out due to patient leaving prior to being seen by health care provider: Secondary | ICD-10-CM

## 2023-10-19 NOTE — ED Triage Notes (Signed)
The pt has had body aches since this am and he just feels sick

## 2023-10-20 LAB — RESP PANEL BY RT-PCR (RSV, FLU A&B, COVID)  RVPGX2
Influenza A by PCR: NEGATIVE
Influenza B by PCR: NEGATIVE
Resp Syncytial Virus by PCR: NEGATIVE
SARS Coronavirus 2 by RT PCR: NEGATIVE

## 2023-10-20 LAB — GROUP A STREP BY PCR: Group A Strep by PCR: NOT DETECTED

## 2023-10-20 NOTE — ED Provider Notes (Signed)
[place] EMERGENCY DEPARTMENT AT [facility] Provider Note   CSN: [id number] Arrival date & time: 10/19/23  2245     History Chief Complaint  Patient presents with   Generalized Body Aches    Steven Hubbard is a 48 y.o. male reportedly otherwise healthy presents to the ER for evaluation of cough and cold symptoms. He reports that these symptoms started yesterday.  Reports an occasional productive cough with rhinorrhea and nasal congestion.  Denies any fever.  Reports diffuse body aches.  Has not tried any over-the-counter medications.  Denies any chest pain or shortness of breath.  No known drug allergies.  Half pack per day smoker.  HPI     Home Medications Prior to Admission medications   Not on File      Allergies    Patient has no known allergies.    Review of Systems   Review of Systems  Constitutional:  Negative for chills and fever.  HENT:  Positive for congestion, rhinorrhea and sore throat. Negative for drooling, ear pain and trouble swallowing.   Respiratory:  Positive for cough. Negative for shortness of breath.   Cardiovascular:  Negative for chest pain.  Gastrointestinal:  Negative for abdominal pain.  Musculoskeletal:  Positive for myalgias.  Skin:  Negative for rash.    Physical Exam Updated Vital Signs BP 131/83   Pulse (!) 57   Temp 97.6 F (36.4 C) (Oral)   Resp 16   Ht 5\' 8"  (1.727 m)   Wt 86.2 kg   SpO2 100%   BMI 28.89 kg/m  Physical Exam Vitals and nursing note reviewed.  Constitutional:      General: He is not in acute distress.    Appearance: He is not toxic-appearing.  HENT:     Head: Normocephalic and atraumatic.     Right Ear: Tympanic membrane, ear canal and external ear normal.     Left Ear: Tympanic membrane, ear canal and external ear normal.     Nose:     Comments: Bilateral nasal turbinate edema and erythema with scant clear nasal discharge.    Mouth/Throat:     Mouth: Mucous membranes are moist.     Comments:  No pharyngeal erythema, exudate, or edema noted.  Uvula midline.  Airway patent.  Moist mucous membranes.  Tonsil stones present on bilateral tonsils.  No tonsillar swelling. Eyes:     General: No scleral icterus.    Conjunctiva/sclera: Conjunctivae normal.  Cardiovascular:     Rate and Rhythm: Normal rate.  Pulmonary:     Effort: Pulmonary effort is normal. No respiratory distress.     Breath sounds: Normal breath sounds. No wheezing or rhonchi.  Musculoskeletal:     Cervical back: Normal range of motion.  Neurological:     General: No focal deficit present.     Mental Status: He is alert.     ED Results / Procedures / Treatments   Labs (all labs ordered are listed, but only abnormal results are displayed) Labs Reviewed  GROUP A STREP BY PCR  RESP PANEL BY RT-PCR (RSV, FLU A&B, COVID)  [id number]    EKG None  Radiology No results found.  Procedures Procedures   Medications Ordered in ED Medications - No data to display  ED Course/ Medical Decision Making/ A&P    Medical Decision Making  48 y.o. male presents to the ER today for evaluation of cough, cold, body aches. Differential diagnosis includes but is not limited to viral illness, COVID,  flu, RSV, bronchitis, PNA. Vital signs mild bradycardia at 57, otherwise unremarkable. Physical exam as noted above.   I independently reviewed and interpreted the patient's labs.  Strep negative.  COVID, flu, RSV pending.  Was alerted by nursing staff the patient was no longer present on the stretcher and eloped.  Portions of this report may have been transcribed using voice recognition software. Every effort was made to ensure accuracy; however, inadvertent computerized transcription errors may be present.    Final Clinical Impression(s) / ED Diagnoses Final diagnoses:  Eloped from emergency department    Rx / DC Orders ED Discharge Orders     None         Achille Rich, PA-C 10/20/23 8469    Maia Plan,  MD 10/25/23 [phone]

## 2023-10-24 DIAGNOSIS — R300 Dysuria: Secondary | ICD-10-CM | POA: Insufficient documentation

## 2023-10-24 DIAGNOSIS — R109 Unspecified abdominal pain: Secondary | ICD-10-CM | POA: Insufficient documentation

## 2023-10-24 DIAGNOSIS — F17200 Nicotine dependence, unspecified, uncomplicated: Secondary | ICD-10-CM | POA: Insufficient documentation

## 2023-10-24 DIAGNOSIS — J45909 Unspecified asthma, uncomplicated: Secondary | ICD-10-CM | POA: Insufficient documentation

## 2023-10-25 ENCOUNTER — Other Ambulatory Visit: Payer: Self-pay

## 2023-10-25 ENCOUNTER — Emergency Department (HOSPITAL_COMMUNITY): Payer: Self-pay

## 2023-10-25 ENCOUNTER — Encounter (HOSPITAL_COMMUNITY): Payer: Self-pay | Admitting: *Deleted

## 2023-10-25 ENCOUNTER — Emergency Department (HOSPITAL_COMMUNITY)
Admission: EM | Admit: 2023-10-25 | Discharge: 2023-10-25 | Disposition: A | Discharge status: Home / Self Care | Payer: Self-pay | Attending: Emergency Medicine | Admitting: Emergency Medicine

## 2023-10-25 DIAGNOSIS — R109 Unspecified abdominal pain: Secondary | ICD-10-CM

## 2023-10-25 LAB — CBC WITH DIFFERENTIAL/PLATELET
Abs Immature Granulocytes: 0.03 10*3/uL (ref 0.00–0.07)
Basophils Absolute: 0 10*3/uL (ref 0.0–0.1)
Basophils Relative: 1 %
Eosinophils Absolute: 0.1 10*3/uL (ref 0.0–0.5)
Eosinophils Relative: 2 %
HCT: 41.7 % (ref 39.0–52.0)
Hemoglobin: 14 g/dL (ref 13.0–17.0)
Immature Granulocytes: 0 %
Lymphocytes Relative: 19 %
Lymphs Abs: 1.3 10*3/uL (ref 0.7–4.0)
MCH: 28.8 pg (ref 26.0–34.0)
MCHC: 33.6 g/dL (ref 30.0–36.0)
MCV: 85.8 fL (ref 80.0–100.0)
Monocytes Absolute: 0.6 10*3/uL (ref 0.1–1.0)
Monocytes Relative: 9 %
Neutro Abs: 4.8 10*3/uL (ref 1.7–7.7)
Neutrophils Relative %: 69 %
Platelets: 228 10*3/uL (ref 150–400)
RBC: 4.86 MIL/uL (ref 4.22–5.81)
RDW: 13.1 % (ref 11.5–15.5)
WBC: 6.9 10*3/uL (ref 4.0–10.5)
nRBC: 0 % (ref 0.0–0.2)

## 2023-10-25 LAB — COMPREHENSIVE METABOLIC PANEL
ALT: 17 U/L (ref 0–44)
AST: 23 U/L (ref 15–41)
Albumin: 4 g/dL (ref 3.5–5.0)
Alkaline Phosphatase: 80 U/L (ref 38–126)
Anion gap: 10 (ref 5–15)
BUN: 9 mg/dL (ref 6–20)
CO2: 25 mmol/L (ref 22–32)
Calcium: 8.8 mg/dL — ABNORMAL LOW (ref 8.9–10.3)
Chloride: 93 mmol/L — ABNORMAL LOW (ref 98–111)
Creatinine, Ser: 0.89 mg/dL (ref 0.61–1.24)
GFR, Estimated: >60 mL/min (ref >60)
Glucose, Bld: 96 mg/dL (ref 70–99)
Potassium: 3.7 mmol/L (ref 3.5–5.1)
Sodium: 128 mmol/L — ABNORMAL LOW (ref 135–145)
Total Bilirubin: 0.7 mg/dL (ref 0.0–1.2)
Total Protein: 7.4 g/dL (ref 6.5–8.1)

## 2023-10-25 LAB — URINALYSIS, ROUTINE W REFLEX MICROSCOPIC
Bilirubin Urine: NEGATIVE
Glucose, UA: NEGATIVE mg/dL
Hgb urine dipstick: NEGATIVE
Ketones, ur: NEGATIVE mg/dL
Leukocytes,Ua: NEGATIVE
Nitrite: NEGATIVE
Protein, ur: NEGATIVE mg/dL
Specific Gravity, Urine: 1.02 (ref 1.005–1.030)
pH: 8 (ref 5.0–8.0)

## 2023-10-25 MED ORDER — ONDANSETRON HCL 4 MG/2ML IJ SOLN
4.0000 mg | Freq: Once | INTRAMUSCULAR | Status: AC
Start: 2023-10-25 — End: 2023-10-25
  Administered 2023-10-25: 4 mg via INTRAVENOUS
  Filled 2023-10-25: qty 2

## 2023-10-25 MED ORDER — MORPHINE SULFATE (PF) 4 MG/ML IV SOLN
4.0000 mg | Freq: Once | INTRAVENOUS | Status: AC
Start: 2023-10-25 — End: 2023-10-25
  Administered 2023-10-25: 4 mg via INTRAVENOUS
  Filled 2023-10-25: qty 1

## 2023-10-25 NOTE — ED Triage Notes (Signed)
The pt is c/o rt flank pain tonight  no pain at present

## 2023-10-25 NOTE — ED Notes (Signed)
Pt ambulated to restroom w/out assistance.

## 2023-10-25 NOTE — Discharge Instructions (Signed)
Your CT scan and lab work today were reassuring.  You may take anti-inflammatory medication and acetaminophen at home as needed for pain.  If you develop any life-threatening symptoms please return to the emergency department.

## 2023-10-25 NOTE — ED Provider Notes (Signed)
[place] EMERGENCY DEPARTMENT AT [facility] Provider Note   CSN: [id number] Arrival date & time: 10/24/23  2343     History  Chief Complaint  Patient presents with   Flank Pain    Steven Hubbard is a 48 y.o. male.  Patient presents to the emergency department complaining of 1 day of right-sided flank pain with some intermittent dysuria.  He denies nausea, vomiting, abdominal pain, shortness of breath, fevers.  Past medical history significant for asthma.   Flank Pain       Home Medications Prior to Admission medications   Not on File      Allergies    Patient has no known allergies.    Review of Systems   Review of Systems  Genitourinary:  Positive for flank pain.    Physical Exam Updated Vital Signs BP (!) 157/104 (BP Location: Right Arm)   Pulse 95   Temp 98.4 F (36.9 C)   Resp 16   Ht 5\' 8"  (1.727 m)   Wt 86.2 kg   SpO2 96%   BMI 28.89 kg/m  Physical Exam Vitals and nursing note reviewed.  HENT:     Head: Normocephalic and atraumatic.  Eyes:     Conjunctiva/sclera: Conjunctivae normal.  Cardiovascular:     Rate and Rhythm: Normal rate and regular rhythm.  Pulmonary:     Effort: Pulmonary effort is normal. No respiratory distress.     Breath sounds: Normal breath sounds.  Abdominal:     Palpations: Abdomen is soft.     Tenderness: There is no abdominal tenderness. There is right CVA tenderness.  Musculoskeletal:        General: No signs of injury.     Cervical back: Normal range of motion.  Skin:    General: Skin is dry.  Neurological:     Mental Status: He is alert.  Psychiatric:        Speech: Speech normal.        Behavior: Behavior normal.     ED Results / Procedures / Treatments   Labs (all labs ordered are listed, but only abnormal results are displayed) Labs Reviewed  COMPREHENSIVE METABOLIC PANEL - Abnormal; Notable for the following components:      Result Value   Sodium 128 (*)    Chloride 93 (*)    Calcium  8.8 (*)    All other components within normal limits  CBC WITH DIFFERENTIAL/PLATELET  URINALYSIS, ROUTINE W REFLEX MICROSCOPIC    EKG None  Radiology CT RENAL STONE STUDY Result Date: 10/25/2023 CLINICAL DATA:  Right flank pain.  Stones suspected. EXAM: CT ABDOMEN AND PELVIS WITHOUT CONTRAST TECHNIQUE: Multidetector CT imaging of the abdomen and pelvis was performed following the standard protocol without IV contrast. RADIATION DOSE REDUCTION: This exam was performed according to the departmental dose-optimization program which includes automated exposure control, adjustment of the mA and/or kV according to patient size and/or use of iterative reconstruction technique. COMPARISON:  11/07/2020 FINDINGS: Lower chest: No acute abnormality. Hepatobiliary: Unremarkable liver. Normal gallbladder. No biliary dilation. Pancreas: Unremarkable. Spleen: Unremarkable. Adrenals/Urinary Tract: Normal adrenal glands. No urinary calculi or hydronephrosis. Bladder is unremarkable. Stomach/Bowel: Normal caliber large and small bowel. No bowel wall thickening. The appendix is normal.Stomach is within normal limits. Vascular/Lymphatic: No significant vascular findings are present. No enlarged abdominal or pelvic lymph nodes. Reproductive: Unremarkable. Other: No free intraperitoneal fluid or air. Musculoskeletal: No acute fracture. IMPRESSION: No acute abnormality in the abdomen and pelvis. Electronically Signed  By: Minerva Fester M.D.   On: 10/25/2023 03:04    Procedures Procedures    Medications Ordered in ED Medications  morphine (PF) 4 MG/ML injection 4 mg (4 mg Intravenous Given 10/25/23 0221)  ondansetron (ZOFRAN) injection 4 mg (4 mg Intravenous Given 10/25/23 0221)    ED Course/ Medical Decision Making/ A&P                                 Medical Decision Making Amount and/or Complexity of Data Reviewed Labs: ordered. Radiology: ordered.  Risk Prescription drug management.   This patient  presents to the ED for concern of right-sided flank pain, this involves an extensive number of treatment options, and is a complaint that carries with it a high risk of complications and morbidity.  The differential diagnosis includes musculoskeletal pain, pyelonephritis, hydronephrosis, nephrolithiasis, others   Co morbidities that complicate the patient evaluation  Asthma   Additional history obtained:  Additional history obtained from visitor at bedside Lab Tests:  I Ordered, and personally interpreted labs.  The pertinent results include: Grossly unremarkable CBC, CMP, UA   Imaging Studies ordered:  I ordered imaging studies including CT renal stone study I independently visualized and interpreted imaging which showed no acute findings I agree with the radiologist interpretation  Problem List / ED Course / Critical interventions / Medication management   I ordered medication including morphine for pain, Zofran for nausea Reevaluation of the patient after these medicines showed that the patient improved I have reviewed the patients home medicines and have made adjustments as needed   Social Determinants of Health:  Patient is a daily tobacco smoker   Test / Admission - Considered:  Lab work and CT scan grossly unremarkable.  Feel the patient's pain is likely musculoskeletal in nature at this time.  No signs of hydronephrosis, pyelonephritis, nephrolithiasis.  Discussed possible prescription for anti-inflammatories but patient declined saying he is not a fan of taking medicine.  Plan to discharge home at this time with return precautions.         Final Clinical Impression(s) / ED Diagnoses Final diagnoses:  Right flank pain    Rx / DC Orders ED Discharge Orders     None         Pamala Duffel 10/25/23 0865    Tilden Fossa, MD 10/25/23 [phone]

## 2023-10-25 NOTE — ED Notes (Signed)
Pt's girlfriend stated pt left and went outside to his vehicle.

## 2023-11-04 ENCOUNTER — Emergency Department (HOSPITAL_COMMUNITY)
Admission: EM | Admit: 2023-11-04 | Discharge: 2023-11-05 | Disposition: A | Discharge status: Home / Self Care | Payer: Self-pay | Attending: Emergency Medicine | Admitting: Emergency Medicine

## 2023-11-04 ENCOUNTER — Emergency Department (HOSPITAL_COMMUNITY)
Admission: EM | Admit: 2023-11-04 | Discharge: 2023-11-04 | Discharge status: Against Medical Advice | Payer: Self-pay | Attending: Emergency Medicine | Admitting: Emergency Medicine

## 2023-11-04 ENCOUNTER — Encounter (HOSPITAL_COMMUNITY): Payer: Self-pay | Admitting: *Deleted

## 2023-11-04 ENCOUNTER — Other Ambulatory Visit: Payer: Self-pay

## 2023-11-04 DIAGNOSIS — R202 Paresthesia of skin: Secondary | ICD-10-CM | POA: Insufficient documentation

## 2023-11-04 DIAGNOSIS — Z5900 Homelessness unspecified: Secondary | ICD-10-CM | POA: Insufficient documentation

## 2023-11-04 DIAGNOSIS — R0781 Pleurodynia: Secondary | ICD-10-CM | POA: Insufficient documentation

## 2023-11-04 DIAGNOSIS — Z5321 Procedure and treatment not carried out due to patient leaving prior to being seen by health care provider: Secondary | ICD-10-CM | POA: Insufficient documentation

## 2023-11-04 DIAGNOSIS — J45909 Unspecified asthma, uncomplicated: Secondary | ICD-10-CM | POA: Insufficient documentation

## 2023-11-04 NOTE — ED Triage Notes (Signed)
The pt is c/o pain in his lt wrist and hand  he was here last pm for a different reason and he reports that he fell asleep in waiting until this am?????

## 2023-11-04 NOTE — ED Notes (Signed)
Pt called several times for triage. No answer.

## 2023-11-04 NOTE — ED Triage Notes (Signed)
The pt cannot be found called x 3 inwaiting he did not wait to be  triaged

## 2023-11-04 NOTE — Discharge Instructions (Signed)
We recommend ibuprofen or aleve for any pain. If symptoms persist, follow up with a hand specialist. Return to the ED for new or concerning symptoms.

## 2023-11-04 NOTE — ED Provider Notes (Signed)
[place] EMERGENCY DEPARTMENT AT [facility] Provider Note   CSN: [id number] Arrival date & time: 11/04/23  2209     History  Chief Complaint  Patient presents with   Hand Pain    Steven Hubbard is a 48 y.o. male.  48 y/o R hand dominant male presents to the ED for evaluation of paresthesias to the L wrist and hand. States that he fell asleep in a chair in the lobby throughout the night. Noted paresthesias in his L forearm and hand upon waking. States he has had some trouble gripping heavier objects without feeling like he is going to drop them. These sensations have been spontaneously improving without intervention.   The history is provided by the patient. No language interpreter was used.  Hand Pain      Home Medications Prior to Admission medications   Not on File      Allergies    Patient has no known allergies.    Review of Systems   Review of Systems Ten systems reviewed and are negative for acute change, except as noted in the HPI.    Physical Exam Updated Vital Signs BP 109/76 (BP Location: Right Arm)   Pulse 71   Temp 98.2 F (36.8 C)   Resp 16   Ht 5\' 8"  (1.727 m)   Wt 86.2 kg   SpO2 97%   BMI 28.89 kg/m   Physical Exam Vitals and nursing note reviewed.  Constitutional:      General: He is not in acute distress.    Appearance: He is well-developed. He is not diaphoretic.     Comments: Nontoxic appearing and in NAD  HENT:     Head: Normocephalic and atraumatic.  Eyes:     General: No scleral icterus.    Conjunctiva/sclera: Conjunctivae normal.  Cardiovascular:     Rate and Rhythm: Normal rate and regular rhythm.     Pulses: Normal pulses.     Comments: Capillary refill brisk in all digits of the L hand. Pulmonary:     Effort: Pulmonary effort is normal. No respiratory distress.  Musculoskeletal:     Cervical back: Normal range of motion.     Comments: Full AROM and PROM of the L wrist. Able to flex and extend wrist against  resistance with preserved strength. No apparent wrist drop. Abduction of digits 4+/5 with adduction of digits at 5/5 strength against resistance. Finger to thumb opposition intact in the L hand. Grip strength preserved; 4+/5. LUE and hand is warm, well perfused with soft compartments. Normal ROM noted proximal to L wrist as well.  Skin:    General: Skin is warm and dry.     Coloration: Skin is not pale.     Findings: No erythema or rash.  Neurological:     Mental Status: He is alert and oriented to person, place, and time.     Coordination: Coordination normal.  Psychiatric:        Behavior: Behavior normal.     ED Results / Procedures / Treatments   Labs (all labs ordered are listed, but only abnormal results are displayed) Labs Reviewed - No data to display  EKG None  Radiology No results found.  Procedures Procedures    Medications Ordered in ED Medications - No data to display  ED Course/ Medical Decision Making/ A&P  Medical Decision Making  This patient presents to the ED for concern of LUE paresthesias, this involves an extensive number of treatment options, and is a complaint that carries with it a high risk of complications and morbidity.  The differential diagnosis includes nerve palsy vs radiculopathy vs    Co morbidities that complicate the patient evaluation  Asthma    Additional history obtained:  External records from outside source obtained and reviewed including negative renal stone study on 10/25/23.   Cardiac Monitoring:  The patient was maintained on a cardiac monitor.  I personally viewed and interpreted the cardiac monitored which showed an underlying rhythm of: NSR   Medicines ordered and prescription drug management:  I have reviewed the patients home medicines and have made adjustments as needed   Test Considered:  Xray L wrist - felt low yield given absence of trauma   Problem List / ED  Course:  Patient is RHD presenting for paresthesias in the L forearm, wrist and hand which he noted upon waking after sleeping in a chair in the ED waiting room all night. Preserved ROM of the LUE as well as the L wrist, hand, and digits. Does have minimal strength changes, as above, but may be effort dependent. He is otherwise neurovascularly intact. No signs of true nerve palsy, though symptoms likely provoked from being malpositioned while sleeping. Extremity is warm, well perfused. There is no hx of trauma.  Patient reports spontaneous improvement to symptoms. I do not see indication for further emergent workup at this time.  I have provided a referral to hand surgery should symptoms persist.  Counseled on the use of anti-inflammatories in the interim.  Return precautions discussed and provided. Patient discharged in stable condition with no unaddressed concerns.   Reevaluation:  After the interventions noted above, I reevaluated the patient and found that they have : remained stable   Social Determinants of Health:  Homeless    Dispostion:  After consideration of the diagnostic results and the patients response to treatment, I feel that the patent would benefit from supportive care. Encouraged specialist f/u for persistent symptoms. Return precautions discussed and provided. Patient discharged in stable condition with no unaddressed concerns.          Final Clinical Impression(s) / ED Diagnoses Final diagnoses:  Paresthesias in left hand    Rx / DC Orders ED Discharge Orders     None         Antony Madura, PA-C 11/04/23 2353    Gilda Crease, MD 11/05/23 [phone]

## 2023-11-16 ENCOUNTER — Emergency Department (HOSPITAL_COMMUNITY): Payer: Self-pay

## 2023-11-16 ENCOUNTER — Other Ambulatory Visit: Payer: Self-pay

## 2023-11-16 ENCOUNTER — Emergency Department (HOSPITAL_COMMUNITY)
Admission: EM | Admit: 2023-11-16 | Discharge: 2023-11-16 | Disposition: A | Discharge status: Home / Self Care | Payer: Self-pay | Attending: Emergency Medicine | Admitting: Emergency Medicine

## 2023-11-16 ENCOUNTER — Encounter (HOSPITAL_COMMUNITY): Payer: Self-pay | Admitting: Emergency Medicine

## 2023-11-16 DIAGNOSIS — M62830 Muscle spasm of back: Secondary | ICD-10-CM | POA: Insufficient documentation

## 2023-11-16 LAB — BASIC METABOLIC PANEL
Anion gap: 11 (ref 5–15)
BUN: 13 mg/dL (ref 6–20)
CO2: 25 mmol/L (ref 22–32)
Calcium: 8.7 mg/dL — ABNORMAL LOW (ref 8.9–10.3)
Chloride: 101 mmol/L (ref 98–111)
Creatinine, Ser: 0.95 mg/dL (ref 0.61–1.24)
GFR, Estimated: >60 mL/min (ref >60)
Glucose, Bld: 88 mg/dL (ref 70–99)
Potassium: 4.1 mmol/L (ref 3.5–5.1)
Sodium: 137 mmol/L (ref 135–145)

## 2023-11-16 LAB — CBC
HCT: 39.6 % (ref 39.0–52.0)
Hemoglobin: 12.9 g/dL — ABNORMAL LOW (ref 13.0–17.0)
MCH: 28.4 pg (ref 26.0–34.0)
MCHC: 32.6 g/dL (ref 30.0–36.0)
MCV: 87.2 fL (ref 80.0–100.0)
Platelets: 247 10*3/uL (ref 150–400)
RBC: 4.54 MIL/uL (ref 4.22–5.81)
RDW: 13.4 % (ref 11.5–15.5)
WBC: 6.3 10*3/uL (ref 4.0–10.5)
nRBC: 0 % (ref 0.0–0.2)

## 2023-11-16 LAB — URINALYSIS, ROUTINE W REFLEX MICROSCOPIC
Bilirubin Urine: NEGATIVE
Glucose, UA: NEGATIVE mg/dL
Hgb urine dipstick: NEGATIVE
Ketones, ur: NEGATIVE mg/dL
Leukocytes,Ua: NEGATIVE
Nitrite: NEGATIVE
Protein, ur: NEGATIVE mg/dL
Specific Gravity, Urine: 1.024 (ref 1.005–1.030)
pH: 5 (ref 5.0–8.0)

## 2023-11-16 MED ORDER — DIAZEPAM 2 MG PO TABS
2.0000 mg | ORAL_TABLET | Freq: Once | ORAL | Status: AC
  Administered 2023-11-16: 2 mg via ORAL
  Filled 2023-11-16: qty 1

## 2023-11-16 MED ORDER — CYCLOBENZAPRINE HCL 10 MG PO TABS
5.0000 mg | ORAL_TABLET | Freq: Two times a day (BID) | ORAL | 0 refills | Status: AC | PRN
Start: 2023-11-16 — End: ?

## 2023-11-16 MED ORDER — KETOROLAC TROMETHAMINE 60 MG/2ML IM SOLN
60.0000 mg | Freq: Once | INTRAMUSCULAR | Status: AC
  Administered 2023-11-16: 60 mg via INTRAMUSCULAR
  Filled 2023-11-16: qty 2

## 2023-11-16 MED ORDER — NAPROXEN 375 MG PO TABS: 375.0000 mg | ORAL_TABLET | Freq: Two times a day (BID) | ORAL | 0 refills | Status: AC

## 2023-11-16 NOTE — Discharge Instructions (Addendum)
SEEK IMMEDIATE MEDICAL ATTENTION IF: New numbness, tingling, weakness, or problem with the use of your arms or legs.  Severe back pain not relieved with medications.  Change in bowel or bladder control.  Increasing pain in any areas of the body (such as chest or abdominal pain).  Shortness of breath, dizziness or fainting.  Nausea (feeling sick to your stomach), vomiting, fever, or sweats.

## 2023-11-16 NOTE — ED Triage Notes (Signed)
Patient complaining of right flank pain x2 weeks. Patient states he was seen for same recently but had to leave before results. Reports increased urinary frequency. Denies fevers/chills.

## 2023-11-16 NOTE — ED Provider Notes (Signed)
 [place] EMERGENCY DEPARTMENT AT [facility] Provider Note   CSN: [id number] Arrival date & time: 11/16/23  0002     History  Chief Complaint  Patient presents with   Flank Pain    Steven Hubbard is a 48 y.o. male who presents the emergency department with chief complaint of right flank pain.  This is the patient's 10th visit in the last 6 months.  He is otherwise healthy.  He was seen for similar complaint on 10/25/2023 and had a CT that was negative for any acute findings as well as a negative workup.  Patient states that he never got to find out the results because he had left prior to completion of his evaluation.  He has intermittent waves of pain in his right flank that are worse with movement bending and twisting.  He denies any urinary symptoms or fevers.  It has been ongoing since he was evaluated on 23 January.   Flank Pain       Home Medications Prior to Admission medications   Medication Sig Start Date End Date Taking? Authorizing Provider  cyclobenzaprine (FLEXERIL) 10 MG tablet Take 0.5-1 tablets (5-10 mg total) by mouth 2 (two) times daily as needed for muscle spasms. 11/16/23  Yes Arthor Captain, PA-C  naproxen (NAPROSYN) 375 MG tablet Take 1 tablet (375 mg total) by mouth 2 (two) times daily with a meal. 11/16/23  Yes Arthor Captain, PA-C      Allergies    Patient has no known allergies.    Review of Systems   Review of Systems  Genitourinary:  Positive for flank pain.    Physical Exam Updated Vital Signs BP 124/84 (BP Location: Right Arm)   Pulse 60   Temp 97.7 F (36.5 C) (Oral)   Resp 18   Ht 5\' 8"  (1.727 m)   Wt 83.9 kg   SpO2 100%   BMI 28.13 kg/m  Physical Exam Vitals and nursing note reviewed.  Constitutional:      General: He is not in acute distress.    Appearance: He is well-developed. He is not diaphoretic.  HENT:     Head: Normocephalic and atraumatic.  Eyes:     General: No scleral icterus.    Conjunctiva/sclera:  Conjunctivae normal.  Cardiovascular:     Rate and Rhythm: Normal rate and regular rhythm.     Heart sounds: Normal heart sounds.  Pulmonary:     Effort: Pulmonary effort is normal. No respiratory distress.     Breath sounds: Normal breath sounds.  Abdominal:     Palpations: Abdomen is soft.     Tenderness: There is no abdominal tenderness.  Musculoskeletal:     Cervical back: Normal range of motion and neck supple.     Comments: Patient appears to have some dextroscoliosis of the lumbar spine.  This may also be secondary to acute spasm.  He has significant spasm and tenderness in the right lumbar paraspinal region.  Pain is worse with movement twisting bending.  He has limited range of motion due to spasm and pain.  Normal strength and reflexes in the lower extremities, normal sensation, no CVA tenderness.  Skin:    General: Skin is warm and dry.  Neurological:     Mental Status: He is alert.  Psychiatric:        Behavior: Behavior normal.     ED Results / Procedures / Treatments   Labs (all labs ordered are listed, but only abnormal results are displayed)  Labs Reviewed  BASIC METABOLIC PANEL - Abnormal; Notable for the following components:      Result Value   Calcium 8.7 (*)    All other components within normal limits  CBC - Abnormal; Notable for the following components:   Hemoglobin 12.9 (*)    All other components within normal limits  URINALYSIS, ROUTINE W REFLEX MICROSCOPIC    EKG None  Radiology DG Lumbar Spine Complete Result Date: 11/16/2023 CLINICAL DATA:  Lumbar pain EXAM: LUMBAR SPINE - COMPLETE 4 VIEW COMPARISON:  10/25/2023 abdominal CT FINDINGS: Accentuated lumbar lordosis. No significant spurring, fracture, or evidence of bone lesion. No detected change from 10/25/2023 abdominal CT. IMPRESSION: No acute or focal finding. Electronically Signed   By: Tiburcio Pea M.D.   On: 11/16/2023 06:40    Procedures Procedures    Medications Ordered in  ED Medications  ketorolac (TORADOL) injection 60 mg (60 mg Intramuscular Given 11/16/23 0650)  diazepam (VALIUM) tablet 2 mg (2 mg Oral Given 11/16/23 4098)    ED Course/ Medical Decision Making/ A&P                                 Medical Decision Making Amount and/or Complexity of Data Reviewed Labs: ordered. Radiology: ordered.  Risk Prescription drug management.   48 year old male who presents emergency department with chief complaint of back pain.  On physical examination he appears to have some scoliosis although he could just have acute spasm.  I have ordered a lumbar spine film which is currently pending.   I reviewed the patient's labs ordered in triage which included a urinalysis which is negative for any findings that are abnormal, BMP and CBC without significant abnormality except for very mild anemia of insignificant value and very mild hypocalcemia of insignificant value. Patient given Toradol and oral Valium here for pain and spasm relief.  Suspect this is all musculoskeletal.   I personally visualized and interpreted the images using our PACS system. Acute findings include:  Lumbar Film unremarkable   I considered CT imagine however work up does not suggest renal colic, Discitis, or other emergent cause of pain. Pain is reproducible  Patient with back pain.  No neurological deficits and normal neuro exam.  Patient can walk but states is painful.  No loss of bowel or bladder control.  No concern for cauda equina.  No fever, night sweats, weight loss, h/o cancer, IVDU.  RICE protocol and pain medicine indicated and discussed with patient.          Final Clinical Impression(s) / ED Diagnoses Final diagnoses:  Lumbar paraspinal muscle spasm    Rx / DC Orders ED Discharge Orders          Ordered    naproxen (NAPROSYN) 375 MG tablet  2 times daily with meals        11/16/23 0641    cyclobenzaprine (FLEXERIL) 10 MG tablet  2 times daily PRN        11/16/23  0641              Arthor Captain, PA-C 11/17/23 1739    Sabas Sous, MD 11/18/23 [phone]

## 2023-11-18 ENCOUNTER — Encounter (HOSPITAL_COMMUNITY): Payer: Self-pay | Admitting: *Deleted

## 2023-11-18 ENCOUNTER — Emergency Department (HOSPITAL_COMMUNITY)
Admission: EM | Admit: 2023-11-18 | Discharge: 2023-11-19 | Disposition: A | Discharge status: Home / Self Care | Payer: Self-pay | Attending: Emergency Medicine | Admitting: Emergency Medicine

## 2023-11-18 ENCOUNTER — Other Ambulatory Visit: Payer: Self-pay

## 2023-11-18 DIAGNOSIS — J45909 Unspecified asthma, uncomplicated: Secondary | ICD-10-CM | POA: Insufficient documentation

## 2023-11-18 DIAGNOSIS — M546 Pain in thoracic spine: Secondary | ICD-10-CM | POA: Insufficient documentation

## 2023-11-18 NOTE — ED Notes (Signed)
 Pt is increasingly drowsy and not able to keep his eyes open.

## 2023-11-18 NOTE — ED Triage Notes (Signed)
 The pt frequents this ed he was last seen 2 days ago and he reports that he had a curve in his spine and wants follow up for this

## 2023-11-19 MED ORDER — KETOROLAC TROMETHAMINE 60 MG/2ML IM SOLN
60.0000 mg | Freq: Once | INTRAMUSCULAR | Status: DC
Start: 2023-11-19 — End: 2023-11-19
  Filled 2023-11-19: qty 2

## 2023-11-19 MED ORDER — METHOCARBAMOL 500 MG PO TABS
500.0000 mg | ORAL_TABLET | Freq: Once | ORAL | Status: AC
  Administered 2023-11-19: 500 mg via ORAL
  Filled 2023-11-19: qty 1

## 2023-11-19 NOTE — ED Provider Notes (Signed)
 [place] EMERGENCY DEPARTMENT AT [facility] Provider Note   CSN: [id number] Arrival date & time: 11/18/23  2220     History  Chief Complaint  Patient presents with   Back Pain    Steven Hubbard is a 48 y.o. male.  The history is provided by the patient and medical records.  Back Pain  48 year old male with history of asthma, presenting to the ED with back pain.  Patient states he was at work this morning and picked up a large piece of sheet metal and feels like he may have pulled something in his back.  Has pain localized to right thoracic back.  Pain worse with movement, especially if twisting a certain way.  Denies numbness/weakness of the legs.  No bowel or bladder incontinence.  No fever/chills.  No meds PTA.  Home Medications Prior to Admission medications   Medication Sig Start Date End Date Taking? Authorizing Provider  cyclobenzaprine (FLEXERIL) 10 MG tablet Take 0.5-1 tablets (5-10 mg total) by mouth 2 (two) times daily as needed for muscle spasms. 11/16/23   Arthor Captain, PA-C  naproxen (NAPROSYN) 375 MG tablet Take 1 tablet (375 mg total) by mouth 2 (two) times daily with a meal. 11/16/23   Arthor Captain, PA-C      Allergies    Patient has no known allergies.    Review of Systems   Review of Systems  Musculoskeletal:  Positive for back pain.  All other systems reviewed and are negative.   Physical Exam Updated Vital Signs BP (!) 140/91 (BP Location: Left Arm)   Pulse (!) 57   Temp (!) 97.5 F (36.4 C) (Oral)   Resp 18   Ht 5\' 8"  (1.727 m)   Wt 83.9 kg   SpO2 99%   BMI 28.12 kg/m  Physical Exam Vitals and nursing note reviewed.  Constitutional:      Appearance: He is well-developed.  HENT:     Head: Normocephalic and atraumatic.  Eyes:     Conjunctiva/sclera: Conjunctivae normal.     Pupils: Pupils are equal, round, and reactive to light.  Cardiovascular:     Rate and Rhythm: Normal rate and regular rhythm.     Heart sounds: Normal  heart sounds.  Pulmonary:     Effort: Pulmonary effort is normal.     Breath sounds: Normal breath sounds.  Abdominal:     General: Bowel sounds are normal.     Palpations: Abdomen is soft.  Musculoskeletal:        General: Normal range of motion.     Cervical back: Normal range of motion.     Comments: Some tenderness along thoracic paraspinal musculature, no significant spasm, no midline step-off or deformity, normal strength and sensation of both legs, remains ambulatory with steady gait  Skin:    General: Skin is warm and dry.  Neurological:     Mental Status: He is alert and oriented to person, place, and time.     ED Results / Procedures / Treatments   Labs (all labs ordered are listed, but only abnormal results are displayed) Labs Reviewed - No data to display  EKG None  Radiology No results found.  Procedures Procedures    Medications Ordered in ED Medications  ketorolac (TORADOL) injection 60 mg (60 mg Intramuscular Patient Refused/Not Given 11/19/23 0353)  methocarbamol (ROBAXIN) tablet 500 mg (500 mg Oral Given 11/19/23 0353)    ED Course/ Medical Decision Making/ A&P  Medical Decision Making Risk Prescription drug management.   48 year old male presenting to the ED with right-sided thoracic back pain.  He was seen here recently for same.  Feels like he may have pulled something after lifting some sheet-metal at work tonight.  Does have some tenderness along the thoracic paraspinal musculature but there is no midline step-off or deformity.  He does not have any focal neurologic deficits.  No bowel or bladder incontinence.  Patient was given Robaxin here, offered Toradol but he refused.  He was just discharged home with prescriptions for Flexeril and naproxen 3 days ago.  He can continue these and follow-up closely with PCP.  Return here for new concerns.  Final Clinical Impression(s) / ED Diagnoses Final diagnoses:  Acute  right-sided thoracic back pain    Rx / DC Orders ED Discharge Orders     None         Garlon Hatchet, PA-C 11/19/23 4540    Zadie Rhine, MD 11/19/23 [phone]

## 2023-11-19 NOTE — Discharge Instructions (Addendum)
 Can continue flexeril and naprosyn for back pain. Can try using heat therapy as well if needed. Return to the ED for new or worsening symptoms.

## 2023-11-24 ENCOUNTER — Emergency Department (HOSPITAL_COMMUNITY)
Admission: EM | Admit: 2023-11-24 | Discharge: 2023-11-24 | Disposition: A | Discharge status: Home / Self Care | Payer: Self-pay | Attending: Emergency Medicine | Admitting: Emergency Medicine

## 2023-11-24 ENCOUNTER — Other Ambulatory Visit: Payer: Self-pay

## 2023-11-24 ENCOUNTER — Encounter (HOSPITAL_COMMUNITY): Payer: Self-pay | Admitting: *Deleted

## 2023-11-24 DIAGNOSIS — Z5900 Homelessness unspecified: Secondary | ICD-10-CM | POA: Insufficient documentation

## 2023-11-24 DIAGNOSIS — Z59819 Housing instability, housed unspecified: Secondary | ICD-10-CM | POA: Insufficient documentation

## 2023-11-24 NOTE — ED Notes (Signed)
Called to room-- no response

## 2023-11-24 NOTE — ED Provider Notes (Signed)
  [facility]PT [facility] Emergency Department Provider Note MRN:  [id number]  Arrival date & time: 11/24/23     Chief Complaint   burning all over  his body   History of Present Illness   Steven Hubbard is a 48 y.o. year-old male presents to the ED with chief complaint of nothing.  Initial complaint in triage was for burning sensation all over his body.  He denies symptoms now.  He is well known to this ER.  Seen frequently for complaints related to homelessness.  History provided by patient.   Review of Systems  Pertinent positive and negative review of systems noted in HPI.    Physical Exam   Vitals:   11/24/23 0210  BP: (!) 138/102  Pulse: 89  Resp: 18  Temp: 98.2 F (36.8 C)  SpO2: 95%    CONSTITUTIONAL:  non toxic-appearing, NAD NEURO:  Alert and oriented x 3, CN 3-12 grossly intact EYES:  eyes equal and reactive ENT/NECK:  Supple, no stridor  CARDIO:  normal rate, regular rhythm, appears well-perfused  PULM:  No respiratory distress, CTAB GI/GU:  non-distended,  MSK/SPINE:  No gross deformities, no edema, moves all extremities  SKIN:  no rash, atraumatic   *Additional and/or pertinent findings included in MDM below  Diagnostic and Interventional Summary    EKG Interpretation Date/Time:    Ventricular Rate:    PR Interval:    QRS Duration:    QT Interval:    QTC Calculation:   R Axis:      Text Interpretation:         Labs Reviewed - No data to display  No orders to display    Medications - No data to display   Procedures  /  Critical Care Procedures  ED Course and Medical Decision Making  I have reviewed the triage vital signs, the nursing notes, and pertinent available records from the EMR.  Social Determinants Affecting Complexity of Care: Patient has no clinically significant social determinants affecting this chief complaint..   ED Course:    Medical Decision Making Patient here initially with complaints of burning  sensation over all his body, but no complaints during by history.    He is moving all extremities and appears in his normal state of health.  He has slept for several hours.  Awakes easily to voice.    I told the patient that we would begin working on his DC papers and his is agreeable with discharge plan.         Consultants: No consultations were needed in caring for this patient.   Treatment and Plan: Emergency department workup does not suggest an emergent condition requiring admission or immediate intervention beyond  what has been performed at this time. The patient is safe for discharge and has  been instructed to return immediately for worsening symptoms, change in  symptoms or any other concerns    Final Clinical Impressions(s) / ED Diagnoses     ICD-10-CM   1. Housing instability  Z59.819       ED Discharge Orders     None         Discharge Instructions Discussed with and Provided to Patient:   Discharge Instructions   None      Roxy Horseman, PA-C 11/24/23 4098    Glynn Octave, MD 11/24/23 [phone]

## 2023-11-24 NOTE — ED Triage Notes (Signed)
 The pt is here almost every night  he's homeless tonight he is c/o a burning sensation all over his body since this am when he woke up
